# Patient Record
Sex: Male | Born: 1965 | Race: White | Hispanic: No | Marital: Married | Smoking: Former smoker
Health system: Southern US, Community
[De-identification: ages and names within clinical notes are randomized; demographics above are authoritative.]

## PROBLEM LIST (undated history)

## (undated) DIAGNOSIS — Z8719 Personal history of other diseases of the digestive system: Secondary | ICD-10-CM

## (undated) DIAGNOSIS — C189 Malignant neoplasm of colon, unspecified: Secondary | ICD-10-CM

## (undated) DIAGNOSIS — M1990 Unspecified osteoarthritis, unspecified site: Secondary | ICD-10-CM

## (undated) DIAGNOSIS — Z860100 Personal history of colon polyps, unspecified: Secondary | ICD-10-CM

## (undated) DIAGNOSIS — Z8711 Personal history of peptic ulcer disease: Secondary | ICD-10-CM

## (undated) DIAGNOSIS — K219 Gastro-esophageal reflux disease without esophagitis: Secondary | ICD-10-CM

## (undated) DIAGNOSIS — Z87442 Personal history of urinary calculi: Secondary | ICD-10-CM

## (undated) DIAGNOSIS — B019 Varicella without complication: Secondary | ICD-10-CM

## (undated) HISTORY — DX: Personal history of colon polyps, unspecified: Z86.0100

## (undated) HISTORY — DX: Malignant neoplasm of colon, unspecified: C18.9

## (undated) HISTORY — PX: FRACTURE SURGERY: SHX138

## (undated) HISTORY — DX: Personal history of colonic polyps: Z86.010

## (undated) HISTORY — PX: TONSILLECTOMY: SUR1361

## (undated) HISTORY — DX: Personal history of other diseases of the digestive system: Z87.19

## (undated) HISTORY — PX: COLONOSCOPY: SHX174

## (undated) HISTORY — PX: FIBULA FRACTURE SURGERY: SHX947

## (undated) HISTORY — DX: Gastro-esophageal reflux disease without esophagitis: K21.9

## (undated) HISTORY — DX: Varicella without complication: B01.9

## (undated) HISTORY — DX: Personal history of peptic ulcer disease: Z87.11

---

## 1998-12-21 DIAGNOSIS — C189 Malignant neoplasm of colon, unspecified: Secondary | ICD-10-CM

## 1998-12-21 HISTORY — PX: COLON SURGERY: SHX602

## 1998-12-21 HISTORY — DX: Malignant neoplasm of colon, unspecified: C18.9

## 2011-05-25 ENCOUNTER — Emergency Department (HOSPITAL_COMMUNITY)
Admission: EM | Admit: 2011-05-25 | Discharge: 2011-05-25 | Disposition: A | Discharge status: Home / Self Care | Payer: Worker's Compensation | Attending: Emergency Medicine | Admitting: Emergency Medicine

## 2011-05-25 ENCOUNTER — Emergency Department (HOSPITAL_COMMUNITY): Payer: Self-pay

## 2011-05-25 DIAGNOSIS — W010XXA Fall on same level from slipping, tripping and stumbling without subsequent striking against object, initial encounter: Secondary | ICD-10-CM | POA: Insufficient documentation

## 2011-05-25 DIAGNOSIS — Z85038 Personal history of other malignant neoplasm of large intestine: Secondary | ICD-10-CM | POA: Insufficient documentation

## 2011-05-25 DIAGNOSIS — S0990XA Unspecified injury of head, initial encounter: Secondary | ICD-10-CM | POA: Insufficient documentation

## 2011-05-25 DIAGNOSIS — S0180XA Unspecified open wound of other part of head, initial encounter: Secondary | ICD-10-CM | POA: Insufficient documentation

## 2011-05-25 DIAGNOSIS — S0083XA Contusion of other part of head, initial encounter: Secondary | ICD-10-CM | POA: Insufficient documentation

## 2011-05-25 DIAGNOSIS — K219 Gastro-esophageal reflux disease without esophagitis: Secondary | ICD-10-CM | POA: Insufficient documentation

## 2011-05-25 DIAGNOSIS — S0003XA Contusion of scalp, initial encounter: Secondary | ICD-10-CM | POA: Insufficient documentation

## 2014-10-02 ENCOUNTER — Encounter: Payer: Self-pay | Admitting: Internal Medicine

## 2014-10-02 ENCOUNTER — Ambulatory Visit (INDEPENDENT_AMBULATORY_CARE_PROVIDER_SITE_OTHER): Payer: BC Managed Care – PPO | Admitting: Internal Medicine

## 2014-10-02 ENCOUNTER — Other Ambulatory Visit: Payer: Self-pay

## 2014-10-02 VITALS — BP 122/82 | HR 64 | Temp 97.9°F | Ht 68.25 in | Wt 180.5 lb

## 2014-10-02 DIAGNOSIS — C189 Malignant neoplasm of colon, unspecified: Secondary | ICD-10-CM

## 2014-10-02 DIAGNOSIS — N529 Male erectile dysfunction, unspecified: Secondary | ICD-10-CM

## 2014-10-02 MED ORDER — TADALAFIL 5 MG PO TABS: 5.0000 mg | ORAL_TABLET | Freq: Every day | ORAL | Status: DC | PRN

## 2014-10-02 NOTE — Progress Notes (Signed)
Pre visit review using our clinic review tool, if applicable. No additional management support is needed unless otherwise documented below in the visit note. 

## 2014-10-02 NOTE — Assessment & Plan Note (Signed)
Will restart cialis RX and coupon for one free month provided

## 2014-10-02 NOTE — Telephone Encounter (Signed)
Pt's wife left v/m requesting # 30 of Cialis 5 mg since Webb Silversmith NP gave pt trial discount card for # 30 Cialis. Please advise. [place]

## 2014-10-02 NOTE — Assessment & Plan Note (Signed)
Will refer to GI for repeat colonoscopy

## 2014-10-02 NOTE — Progress Notes (Signed)
HPI  Pt presents to the clinic today to establish care. He has not had a PCP in many years. He has a history of colon cancer s/p resection, colostomy, colostomy reversal and chemo. He has not followed up with GI or Oncology since 2006. Additionally, he would like to be put back on his daily cialis.  Flu: 09/2014 Tetanus: > 10 years ago Eye Doctor: as needed Dentist: as needed Colon Screening: ?2006 (hx of colon cancer)  Past Medical History  Diagnosis Date  . Colon cancer 2000  . Chicken pox   . Kidney stones   . Hx of colonic polyps   . History of stomach ulcers     Current Outpatient Prescriptions  Medication Sig Dispense Refill  . aspirin EC 81 MG tablet Take 1 tablet by mouth daily.      . clindamycin (CLEOCIN) 300 MG capsule Take 300 mg by mouth 2 (two) times daily. For cellulitis      . Multiple Vitamin (MULTI-VITAMINS) TABS Take 1 tablet by mouth.      . ranitidine (ZANTAC) 75 MG tablet Take 1 tablet by mouth 2 (two) times daily.       No current facility-administered medications for this visit.    Allergies  Allergen Reactions  . Fluconazole Rash    Family History  Problem Relation Age of Onset  . Cancer Father     Cancerous Polyps  . Heart disease Father   . Hyperlipidemia Father   . Cancer Paternal Grandfather     Lung    History   Social History  . Marital Status: Married    Spouse Name: N/A    Number of Children: N/A  . Years of Education: N/A   Occupational History  . Not on file.   Social History Main Topics  . Smoking status: Former Research scientist (life sciences)  . Smokeless tobacco: Never Used     Comment: Quit in 1999  . Alcohol Use: No  . Drug Use: Not on file  . Sexual Activity: Not on file   Other Topics Concern  . Not on file   Social History Narrative  . No narrative on file    ROS:  Constitutional: Denies fever, malaise, fatigue, headache or abrupt weight changes.  Respiratory: Denies difficulty breathing, shortness of breath, cough or sputum  production.   Cardiovascular: Denies chest pain, chest tightness, palpitations or swelling in the hands or feet.  Gastrointestinal: Denies abdominal pain, bloating, constipation, diarrhea or blood in the stool.  GU: Pt reports erectile dysfunction. Denies frequency, urgency, pain with urination, blood in urine, odor or discharge.  No other specific complaints in a complete review of systems (except as listed in HPI above).  PE:  Ht 5' 8.25" (1.734 m)  Wt 180 lb 8 oz (81.874 kg)  BMI 27.23 kg/m2 Wt Readings from Last 3 Encounters:  10/02/14 180 lb 8 oz (81.874 kg)    General: Appears his stated age, well developed, well nourished in NAD. Cardiovascular: Normal rate and rhythm. S1,S2 noted.  No murmur, rubs or gallops noted.  Pulmonary/Chest: Normal effort and positive vesicular breath sounds. No respiratory distress. No wheezes, rales or ronchi noted.  Abdomen: Soft and nontender. Normal bowel sounds, no bruits noted. No distention or masses noted. Liver, spleen and kidneys non palpable.   Assessment and Plan:

## 2014-10-02 NOTE — Patient Instructions (Addendum)
Erectile Dysfunction Erectile dysfunction is the inability to get or sustain a good enough erection to have sexual intercourse. Erectile dysfunction may involve:  Inability to get an erection.  Lack of enough hardness to allow penetration.  Loss of the erection before sex is finished.  Premature ejaculation. CAUSES  Certain drugs, such as:  Pain relievers.  Antihistamines.  Antidepressants.  Blood pressure medicines.  Water pills (diuretics).  Ulcer medicines.  Muscle relaxants.  Illegal drugs.  Excessive drinking.  Psychological causes, such as:  Anxiety.  Depression.  Sadness.  Exhaustion.  Performance fear.  Stress.  Physical causes, such as:  Artery problems. This may include diabetes, smoking, liver disease, or atherosclerosis.  High blood pressure.  Hormonal problems, such as low testosterone.  Obesity.  Nerve problems. This may include back or pelvic injuries, diabetes mellitus, multiple sclerosis, or Parkinson disease. SYMPTOMS  Inability to get an erection.  Lack of enough hardness to allow penetration.  Loss of the erection before sex is finished.  Premature ejaculation.  Normal erections at some times, but with frequent unsatisfactory episodes.  Orgasms that are not satisfactory in sensation or frequency.  Low sexual satisfaction in either partner because of erection problems.  A curved penis occurring with erection. The curve may cause pain or may be too curved to allow for intercourse.  Never having nighttime erections. DIAGNOSIS Your caregiver can often diagnose this condition by:  Performing a physical exam to find other diseases or specific problems with the penis.  Asking you detailed questions about the problem.  Performing blood tests to check for diabetes mellitus or to measure hormone levels.  Performing urine tests to find other underlying health conditions.  Performing an ultrasound exam to check for  scarring.  Performing a test to check blood flow to the penis.  Doing a sleep study at home to measure nighttime erections. TREATMENT   You may be prescribed medicines by mouth.  You may be given medicine injections into the penis.  You may be prescribed a vacuum pump with a ring.  Penile implant surgery may be performed. You may receive:  An inflatable implant.  A semirigid implant.  Blood vessel surgery may be performed. HOME CARE INSTRUCTIONS  If you are prescribed oral medicine, you should take the medicine as prescribed. Do not increase the dosage without first discussing it with your physician.  If you are using self-injections, be careful to avoid any veins that are on the surface of the penis. Apply pressure to the injection site for 5 minutes.  If you are using a vacuum pump, make sure you have read the instructions before using it. Discuss any questions with your physician before taking the pump home. SEEK MEDICAL CARE IF:  You experience pain that is not responsive to the pain medicine you have been prescribed.  You experience nausea or vomiting. SEEK IMMEDIATE MEDICAL CARE IF:   When taking oral or injectable medications, you experience an erection that lasts longer than 4 hours. If your physician is unavailable, go to the nearest emergency room for evaluation. An erection that lasts much longer than 4 hours can result in permanent damage to your penis.  You have pain that is severe.  You develop redness, severe pain, or severe swelling of your penis.  You have redness spreading up into your groin or lower abdomen.  You are unable to pass your urine. Document Released: 12/04/2000 Document Revised: 08/09/2013 Document Reviewed: 05/11/2013 [facility] Patient Information 2015 [place], [place]. This information is not  intended to replace advice given to you by your health care provider. Make sure you discuss any questions you have with your health care provider.

## 2014-11-01 ENCOUNTER — Ambulatory Visit: Payer: Self-pay | Admitting: Unknown Physician Specialty

## 2014-11-21 ENCOUNTER — Emergency Department: Payer: Self-pay | Admitting: Emergency Medicine

## 2014-11-21 ENCOUNTER — Telehealth: Payer: Self-pay

## 2014-11-21 LAB — URINALYSIS, COMPLETE
BACTERIA: NONE SEEN
Bilirubin,UR: NEGATIVE
Blood: NEGATIVE
Glucose,UR: NEGATIVE mg/dL (ref 0–75)
LEUKOCYTE ESTERASE: NEGATIVE
NITRITE: NEGATIVE
PH: 6 (ref 4.5–8.0)
PROTEIN: NEGATIVE
RBC,UR: <1 /HPF (ref 0–5)
SPECIFIC GRAVITY: 1.006 (ref 1.003–1.030)
Squamous Epithelial: NONE SEEN

## 2014-11-21 LAB — CBC WITH DIFFERENTIAL/PLATELET
BASOS ABS: 0 10*3/uL (ref 0.0–0.1)
BASOS PCT: 0.4 %
EOS ABS: 0.1 10*3/uL (ref 0.0–0.7)
Eosinophil %: 2.5 %
HCT: 41.9 % (ref 40.0–52.0)
HGB: 13.9 g/dL (ref 13.0–18.0)
LYMPHS ABS: 1.8 10*3/uL (ref 1.0–3.6)
Lymphocyte %: 40.9 %
MCH: 30.6 pg (ref 26.0–34.0)
MCHC: 33.2 g/dL (ref 32.0–36.0)
MCV: 92 fL (ref 80–100)
MONO ABS: 0.6 x10 3/mm (ref 0.2–1.0)
Monocyte %: 13.9 %
NEUTROS ABS: 1.9 10*3/uL (ref 1.4–6.5)
Neutrophil %: 42.3 %
Platelet: 229 10*3/uL (ref 150–440)
RBC: 4.54 10*6/uL (ref 4.40–5.90)
RDW: 13.1 % (ref 11.5–14.5)
WBC: 4.4 10*3/uL (ref 3.8–10.6)

## 2014-11-21 LAB — COMPREHENSIVE METABOLIC PANEL
ALBUMIN: 3.4 g/dL (ref 3.4–5.0)
ANION GAP: 8 (ref 7–16)
Alkaline Phosphatase: 148 U/L — ABNORMAL HIGH
BUN: 14 mg/dL (ref 7–18)
Bilirubin,Total: 0.4 mg/dL (ref 0.2–1.0)
CALCIUM: 8.9 mg/dL (ref 8.5–10.1)
CO2: 25 mmol/L (ref 21–32)
CREATININE: 0.89 mg/dL (ref 0.60–1.30)
Chloride: 105 mmol/L (ref 98–107)
EGFR (African American): >60
GLUCOSE: 88 mg/dL (ref 65–99)
OSMOLALITY: 276 (ref 275–301)
POTASSIUM: 3.9 mmol/L (ref 3.5–5.1)
SGOT(AST): 29 U/L (ref 15–37)
SGPT (ALT): 40 U/L
SODIUM: 138 mmol/L (ref 136–145)
TOTAL PROTEIN: 7.1 g/dL (ref 6.4–8.2)

## 2014-11-21 LAB — LACTATE DEHYDROGENASE: LDH: 187 U/L (ref 85–241)

## 2014-11-21 LAB — TROPONIN I: Troponin-I: <0.02 ng/mL

## 2014-11-21 LAB — LIPASE, BLOOD: LIPASE: 65 U/L — AB (ref 73–393)

## 2014-11-21 NOTE — Telephone Encounter (Signed)
PLEASE NOTE: All timestamps contained within this report are represented as [place] Standard Time. CONFIDENTIALTY NOTICE: This fax transmission is intended only for the addressee. It contains information that is legally privileged, confidential or otherwise protected from use or disclosure. If you are not the intended recipient, you are strictly prohibited from reviewing, disclosing, copying using or disseminating any of this information or taking any action in reliance on or regarding this information. If you have received this fax in error, please notify us immediately by telephone so that we can arrange for its return to [place]. Phone: [phone], Toll-Free: [phone], Fax: [phone] Page: 1 of 2 Call Id: [id number] [place] Patient Name: Alex Carr Gender: Male DOB: [birth year] Age: 48 Y 3 M 10 D Return Phone Number: [phone] (Primary) Address: [address] City/State/Zip: [place] [address] Client [facility] - Client Client Site [facility], [place] Type Call Call Type Triage / [place] Name Vaughan Basta Relationship To Patient Spouse Return Phone Number [phone] (Primary) Chief Complaint Abdominal Pain Initial Comment Caller states husband was put on new medication about a month ago. having trouble with digestive system. today, having abdominal pain, not wanting to eat. cramping PreDisposition Did not know what to do Nurse Assessment Nurse: Mechele Dawley, RN, Amy Date/Time Eilene Ghazi Time): 11/21/2014 3:27:07 PM Confirm and document reason for call. If symptomatic, describe symptoms. ---COLON CA - 2000 - SPOUSE STATES THAT HE HAS NOT BEEN FEELING WELL. HE HAS BEEN STARTED ON PRILOSEC ABOUT A MONTH AGO AND THAT IS ABOUT THE TIME THAT HE STARTED HAVING THESE SYMPTOMS. CRAMPING, CAN'T SLEEP, NOT  HUNGRY, IRREGULAR BM'S. COLOSTOMY BAG FEELS LIKE THERE IS A KNOT THERE. COLON SPASMS PREVIOUS BUT HE FEELS THIS IS DIFFERENT PAIN. Has the patient traveled out of the country within the last 30 days? ---Not Applicable Does the patient require triage? ---Yes Related visit to physician within the last 2 weeks? ---No Does the PT have any chronic conditions? (i.e. diabetes, asthma, etc.) ---Yes List chronic conditions. ---CA - 200 COLON CA DX ULCER MUCH YOUNGER AGE Guidelines Guideline Title Affirmed Question Affirmed Notes Nurse Date/Time (Eastern Time) Abdominal Pain - Male Patient sounds very sick or weak to the triager SPOUSE IS HAVING TO CALL DUE TO HIM BEING AT WORK - DUE TO HISTORY NEEDS TO [place], RN, [place] 11/21/2014 3:31:38 PM Disp. Time Eilene Ghazi Time) Disposition Final User PLEASE NOTE: All timestamps contained within this report are represented as [place] Standard Time. CONFIDENTIALTY NOTICE: This fax transmission is intended only for the addressee. It contains information that is legally privileged, confidential or otherwise protected from use or disclosure. If you are not the intended recipient, you are strictly prohibited from reviewing, disclosing, copying using or disseminating any of this information or taking any action in reliance on or regarding this information. If you have received this fax in error, please notify us immediately by telephone so that we can arrange for its return to [place]. Phone: [phone], Toll-Free: [phone], Fax: [phone] Page: 2 of 2 Call Id: [id number] 11/21/2014 3:37:11 PM Go to ED Now (or PCP triage) Yes Mechele Dawley, RN, Amy Caller Understands: Yes Disagree/Comply: Comply Care Advice Given Per Guideline GO TO ED NOW (OR PCP TRIAGE): CARE ADVICE given per Abdominal Pain, Male (Adult) guideline. DRIVING: Another adult should drive. Referrals GO TO FACILITY UNDECIDED

## 2014-11-23 ENCOUNTER — Ambulatory Visit: Payer: Self-pay | Admitting: Hematology and Oncology

## 2014-11-23 LAB — KAPPA/LAMBDA FREE LIGHT CHAINS (ARMC)

## 2014-11-23 LAB — PROTEIN ELECTROPHORESIS(ARMC)

## 2014-11-23 LAB — PSA: PSA: 0.6 ng/mL (ref 0.0–4.0)

## 2014-11-23 LAB — CEA: CEA: 0.8 ng/mL (ref 0.0–4.7)

## 2014-12-21 ENCOUNTER — Ambulatory Visit: Payer: Self-pay | Admitting: Hematology and Oncology

## 2014-12-24 ENCOUNTER — Other Ambulatory Visit: Payer: Self-pay | Admitting: Internal Medicine

## 2014-12-24 MED ORDER — OMEPRAZOLE 20 MG PO CPDR: 20.0000 mg | DELAYED_RELEASE_CAPSULE | Freq: Every day | ORAL | Status: DC

## 2014-12-27 ENCOUNTER — Ambulatory Visit: Payer: Self-pay | Admitting: Gastroenterology

## 2015-01-07 ENCOUNTER — Encounter: Payer: Self-pay | Admitting: Internal Medicine

## 2015-02-26 ENCOUNTER — Ambulatory Visit
Admit: 2015-02-26 | Disposition: A | Discharge status: Home / Self Care | Payer: Self-pay | Attending: Hematology and Oncology | Admitting: Hematology and Oncology

## 2015-05-11 ENCOUNTER — Other Ambulatory Visit: Payer: Self-pay | Admitting: Internal Medicine

## 2015-07-25 LAB — LIPID PANEL
Cholesterol: 158 mg/dL (ref 0–200)
HDL: 66 mg/dL (ref 35–70)
LDL Cholesterol: 78 mg/dL
TRIGLYCERIDES: 70 mg/dL (ref 40–160)

## 2015-07-25 LAB — TSH: TSH: 2.23 u[IU]/mL (ref <=5.90)

## 2015-07-25 LAB — HEPATIC FUNCTION PANEL: Alkaline Phosphatase: 168 U/L — AB (ref 25–125)

## 2015-07-25 LAB — HEMOGLOBIN A1C: Hemoglobin A1C: 5.4

## 2015-10-11 ENCOUNTER — Other Ambulatory Visit: Payer: Self-pay | Admitting: Internal Medicine

## 2015-10-11 NOTE — Telephone Encounter (Signed)
Last filled 10/02/14--est care appt--no upcoming appts--please advise

## 2015-12-19 ENCOUNTER — Ambulatory Visit (INDEPENDENT_AMBULATORY_CARE_PROVIDER_SITE_OTHER): Payer: BLUE CROSS/BLUE SHIELD | Admitting: Internal Medicine

## 2015-12-19 ENCOUNTER — Encounter: Payer: Self-pay | Admitting: Internal Medicine

## 2015-12-19 VITALS — BP 126/82 | HR 77 | Temp 98.1°F | Ht 68.5 in | Wt 198.0 lb

## 2015-12-19 DIAGNOSIS — Z0000 Encounter for general adult medical examination without abnormal findings: Secondary | ICD-10-CM | POA: Diagnosis not present

## 2015-12-19 DIAGNOSIS — Z23 Encounter for immunization: Secondary | ICD-10-CM | POA: Diagnosis not present

## 2015-12-19 MED ORDER — OMEPRAZOLE 20 MG PO CPDR: 20.0000 mg | DELAYED_RELEASE_CAPSULE | Freq: Every day | ORAL | Status: DC

## 2015-12-19 NOTE — Addendum Note (Signed)
Addended by: Lurlean Nanny on: 12/19/2015 01:49 PM   Modules accepted: Orders

## 2015-12-19 NOTE — Addendum Note (Signed)
Addended by: Lurlean Nanny on: 12/19/2015 05:04 PM   Modules accepted: Orders

## 2015-12-19 NOTE — Progress Notes (Signed)
Subjective:    Patient ID: Alex Carr, male    DOB: March 12, 1966, 49 y.o.   MRN: [id number]  HPI  Pt presents to the clinic today for his annual exam.  Flu: 09/2015 Tetanus: > 10 years ago PSA Screening: 11/2015 Colon Screening: 12/27/2014 Vision Screening: 04/2015 Dentist: as needed  Diet: He does eat lean meat. He consumes fruits and veggies a few days per week. He does not consume any fried foods. He drinks mostly water and coffee, and unsweet tea. Exercise: None  Review of Systems      Past Medical History  Diagnosis Date  . Colon cancer 2000  . Chicken pox   . Kidney stones   . Hx of colonic polyps   . History of stomach ulcers     Current Outpatient Prescriptions  Medication Sig Dispense Refill  . aspirin EC 81 MG tablet Take 1 tablet by mouth daily.    . clindamycin (CLEOCIN) 300 MG capsule Take 300 mg by mouth 2 (two) times daily. For cellulitis    . Multiple Vitamin (MULTI-VITAMINS) TABS Take 1 tablet by mouth.    Marland Kitchen omeprazole (PRILOSEC) 20 MG capsule TAKE ONE CAPSULE BY MOUTH ONCE DAILY 30 capsule 6  . ranitidine (ZANTAC) 75 MG tablet Take 1 tablet by mouth 2 (two) times daily.    . tadalafil (CIALIS) 5 MG tablet Take 1 tablet (5 mg total) by mouth daily as needed for erectile dysfunction. MUST SCHEDULE ANNUAL PHYSICAL FOR MORE REFILLS [phone] 30 tablet 0   No current facility-administered medications for this visit.    Allergies  Allergen Reactions  . Fluconazole Rash    Family History  Problem Relation Age of Onset  . Cancer Father     Cancerous Polyps  . Heart disease Father   . Hyperlipidemia Father   . Cancer Paternal Grandfather     Lung    Social History   Social History  . Marital Status: Married    Spouse Name: N/A  . Number of Children: N/A  . Years of Education: N/A   Occupational History  . Not on file.   Social History Main Topics  . Smoking status: Former Research scientist (life sciences)  . Smokeless tobacco: Never Used     Comment: Quit in  1999  . Alcohol Use: No  . Drug Use: No  . Sexual Activity: Yes   Other Topics Concern  . Not on file   Social History Narrative  . No narrative on file     Constitutional: Denies fever, malaise, fatigue, headache or abrupt weight changes.  HEENT: Denies eye pain, eye redness, ear pain, ringing in the ears, wax buildup, runny nose, nasal congestion, bloody nose, or sore throat. Respiratory: Denies difficulty breathing, shortness of breath, cough or sputum production.   Cardiovascular: Denies chest pain, chest tightness, palpitations or swelling in the hands or feet.  Gastrointestinal: Pt reports occasional reflux. Denies abdominal pain, bloating, constipation, diarrhea or blood in the stool.  GU: Denies urgency, frequency, pain with urination, burning sensation, blood in urine, odor or discharge. Musculoskeletal: Denies decrease in range of motion, difficulty with gait, muscle pain or joint pain and swelling.  Skin: Denies redness, rashes, lesions or ulcercations.  Neurological: Denies dizziness, difficulty with memory, difficulty with speech or problems with balance and coordination.  Psych: Denies anxiety, depression, SI/HI.  No other specific complaints in a complete review of systems (except as listed in HPI above).  Objective:   Physical Exam  BP 126/82 mmHg  Pulse 77  Temp(Src) 98.1 F (36.7 C) (Oral)  Ht 5' 8.5" (1.74 m)  Wt 198 lb (89.812 kg)  BMI 29.66 kg/m2  SpO2 98% Wt Readings from Last 3 Encounters:  12/19/15 198 lb (89.812 kg)  10/02/14 180 lb 8 oz (81.874 kg)    General: Appears his stated age, well developed, well nourished in NAD. Skin: Warm, dry and intact. No rashes, lesions or ulcerations noted. Large abdominal scar noted. HEENT: Head: normal shape and size; Eyes: sclera white, no icterus, conjunctiva pink, PERRLA and EOMs intact; Ears: cerumen impaction bilaterally; Nose: mucosa pink and moist, septum midline; Throat/Mouth: Teeth present, mucosa pink  and moist, no exudate, lesions or ulcerations noted.  Neck:  Neck supple, trachea midline. No masses, lumps or thyromegaly present.  Cardiovascular: Normal rate and rhythm. S1,S2 noted.  No murmur, rubs or gallops noted. No JVD or BLE edema. No carotid bruits noted. Pulmonary/Chest: Normal effort and positive vesicular breath sounds. No respiratory distress. No wheezes, rales or ronchi noted.  Abdomen: Soft and nontender. Normal bowel sounds. No distention or masses noted. Liver, spleen and kidneys non palpable. Musculoskeletal: Strength 5/5 BUE/BLE. No signs of joint swelling. No difficulty with gait.  Neurological: Alert and oriented. Cranial nerves II-XII grossly  intact. Coordination normal.  Psychiatric: Mood and affect normal. Behavior is normal. Judgment and thought content normal.     BMET    Component Value Date/Time   NA 138 11/21/2014 1758   K 3.9 11/21/2014 1758   CL 105 11/21/2014 1758   CO2 25 11/21/2014 1758   GLUCOSE 88 11/21/2014 1758   BUN 14 11/21/2014 1758   CREATININE 0.89 11/21/2014 1758   CALCIUM 8.9 11/21/2014 1758   GFRNONAA >60 11/21/2014 1758   GFRAA >60 11/21/2014 1758    Lipid Panel  No results found for: CHOL, TRIG, HDL, CHOLHDL, VLDL, LDLCALC  CBC    Component Value Date/Time   WBC 4.4 11/21/2014 1758   RBC 4.54 11/21/2014 1758   HGB 13.9 11/21/2014 1758   HCT 41.9 11/21/2014 1758   PLT 229 11/21/2014 1758   MCV 92 11/21/2014 1758   MCH 30.6 11/21/2014 1758   MCHC 33.2 11/21/2014 1758   RDW 13.1 11/21/2014 1758   LYMPHSABS 1.8 11/21/2014 1758   MONOABS 0.6 11/21/2014 1758   EOSABS 0.1 11/21/2014 1758   BASOSABS 0.0 11/21/2014 1758    Hgb A1C No results found for: HGBA1C       Assessment & Plan:   Preventative Health Maintenance:  Flu UTD Tdap today Colonoscopy UTD Encouraged him to see an eye doctor and dentist annually Labs from 07/2015 reviewed- all normal Encouraged him to consume a balanced diet and start an exercise  regimen  RTC in 1 year of sooner if needed

## 2015-12-19 NOTE — Progress Notes (Signed)
Pre visit review using our clinic review tool, if applicable. No additional management support is needed unless otherwise documented below in the visit note. 

## 2015-12-19 NOTE — Patient Instructions (Signed)

## 2016-02-10 IMAGING — CT CT ABD-PELV W/ CM
2 of 5 series · 15 of 46 positions shown, 17 images · IV contrast (isovue)
Comparison: None.

CLINICAL DATA: Significant left lower quadrant pain with
intermittent cramping for 2 days, history of colostomy, history of
colon cancer.

EXAM:
CT ABDOMEN AND PELVIS WITH CONTRAST
TECHNIQUE: Multidetector CT imaging of the abdomen and pelvis was performed
using the standard protocol following bolus administration of
intravenous contrast.
CONTRAST:  100 cc Isovue

[Series 2: routine abd pel with · axial · 0.80mm/px · z∈[-1163,-758]mm · 12 of 93 slices shown, 14 images]
[im 6/93  soft-tissue]
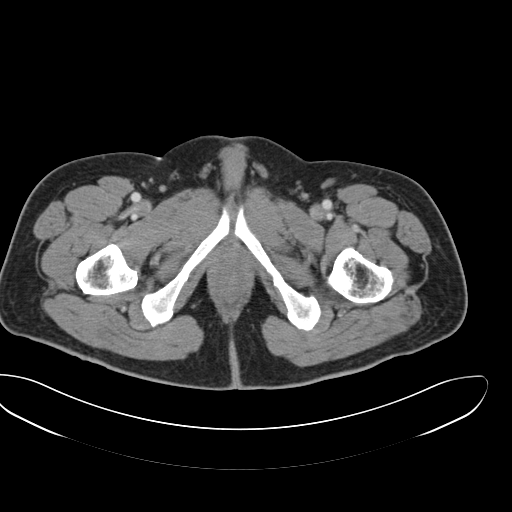
[im 6/93  bone]
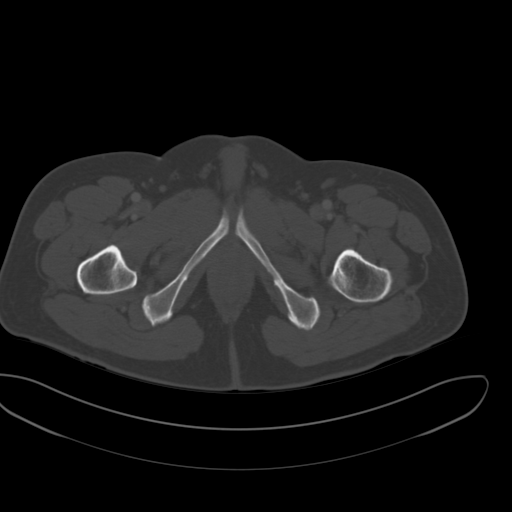
[im 16/93  soft-tissue]
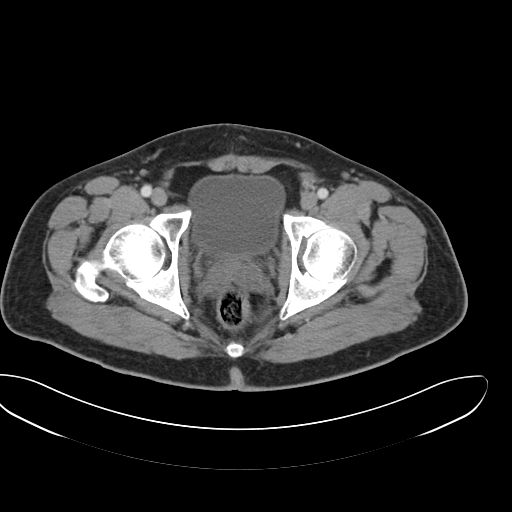
[im 21/93  soft-tissue]
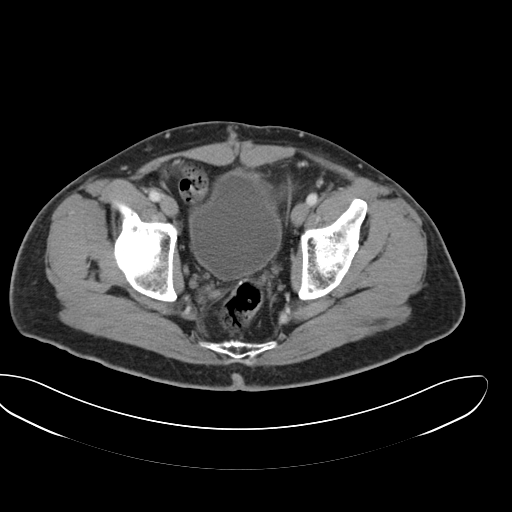
[im 26/93  soft-tissue]
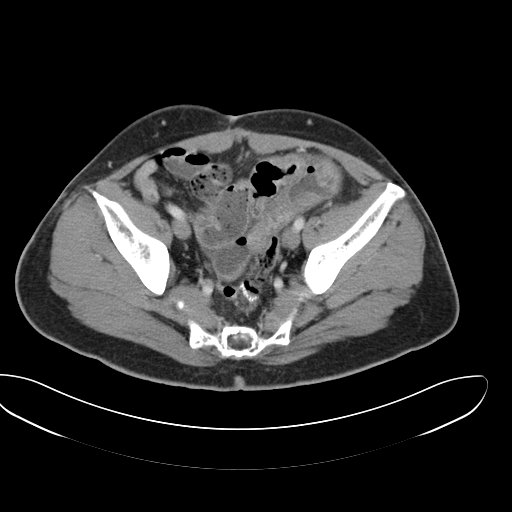
[im 36/93  soft-tissue]
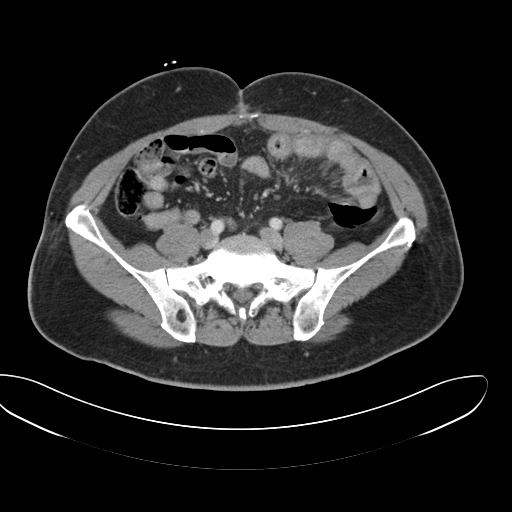
[im 41/93  soft-tissue]
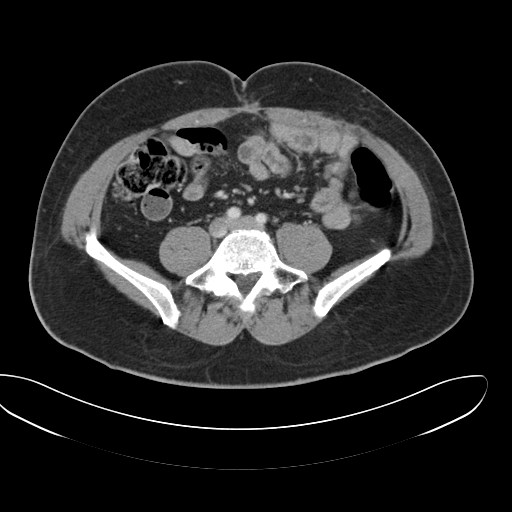
[im 52/93  soft-tissue]
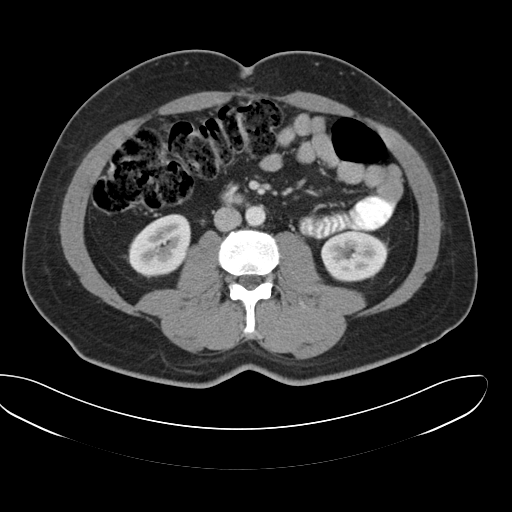
[im 57/93  soft-tissue]
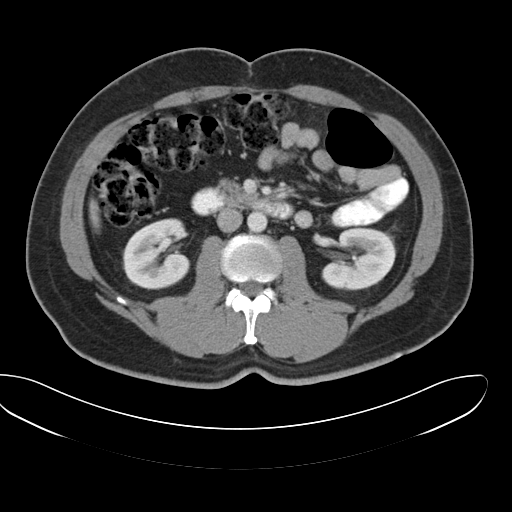
[im 67/93  soft-tissue]
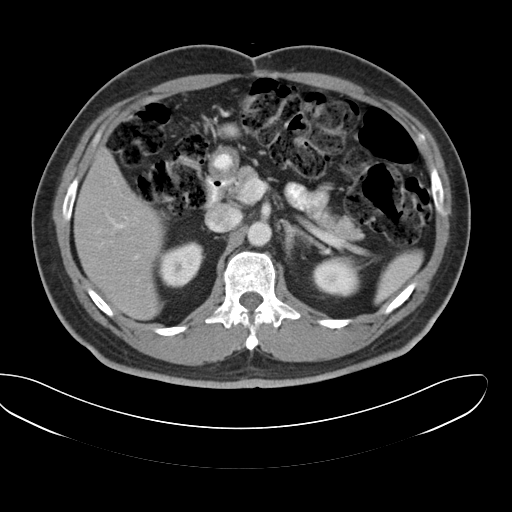
[im 67/93  bone]
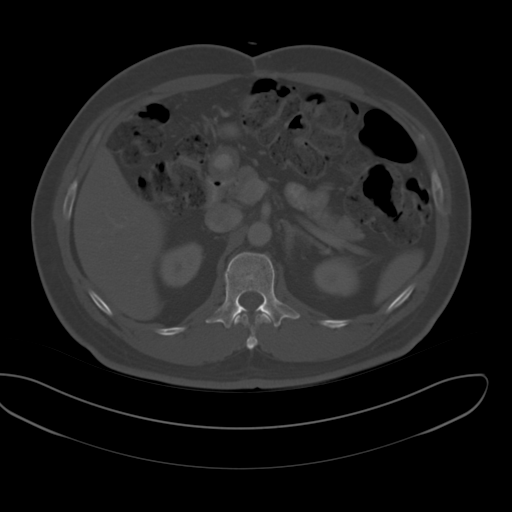
[im 72/93  soft-tissue]
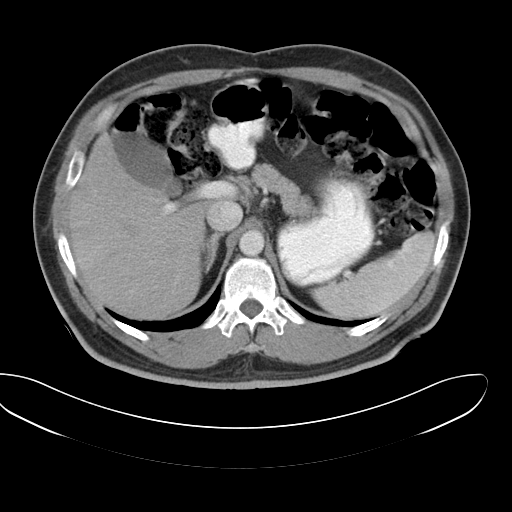
[im 77/93  soft-tissue]
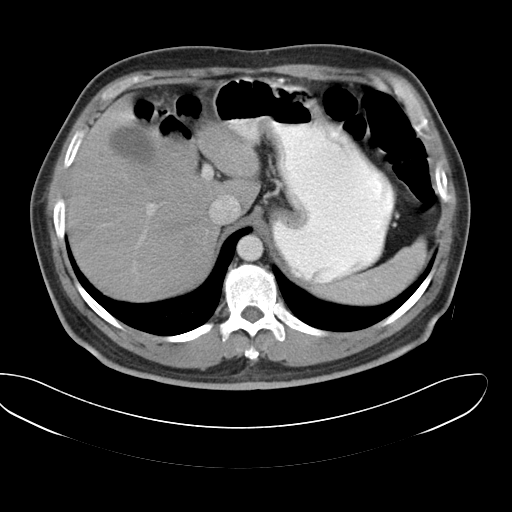
[im 87/93  soft-tissue]
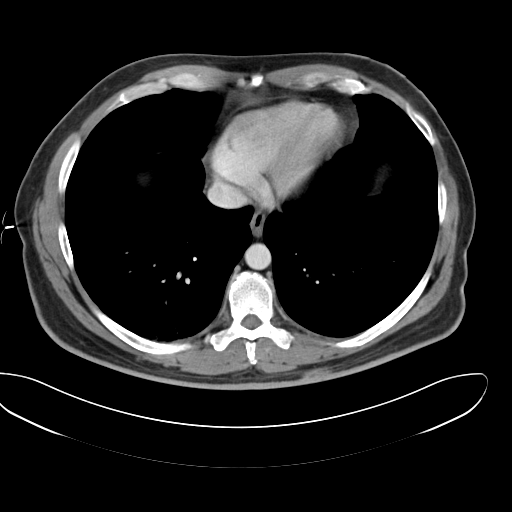

[Series 6: cor routine abd pel with · coronal · 0.76mm/px · 3 of 131 slices shown]
[im 44/131  soft-tissue]
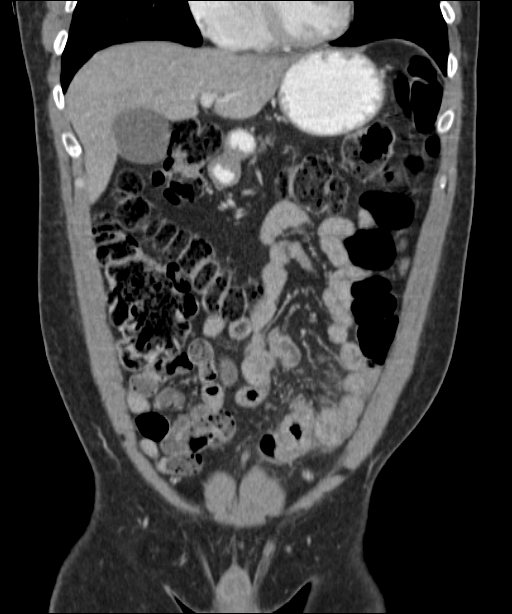
[im 58/131  soft-tissue]
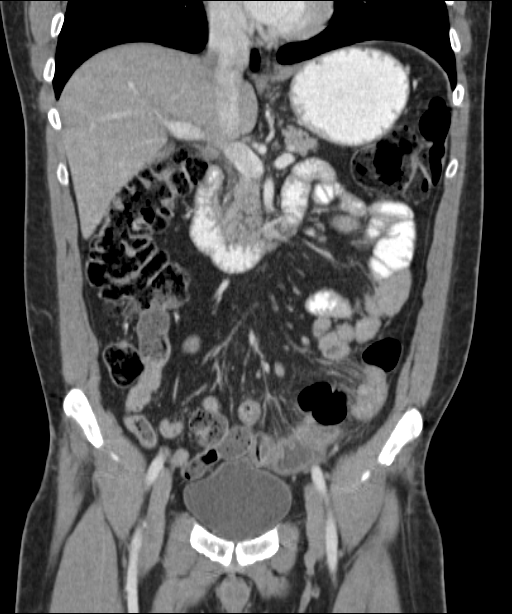
[im 73/131  soft-tissue]
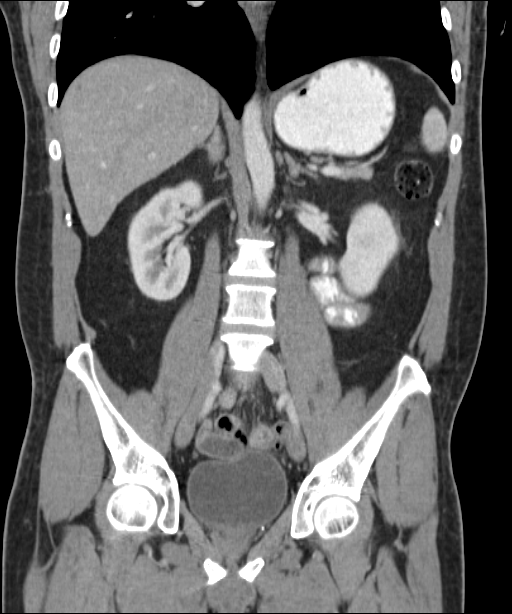

[15 of 46 positions shown; findings below may reference images not displayed]

FINDINGS: Sagittal images of the spine shows mild degenerative changes
thoracolumbar spine. There is symmetrical central lytic appearance
within sacrum with peripheral sclerosis and loss of trabeculation
there is no cortical destruction. Findings are suspicious for
Paget's disease or postradiation changes. Less likely metastatic
disease. Clinical correlation is necessary. Further correlation with
MRI could be performed as clinically warranted.

Enhanced liver is unremarkable. The pancreas, spleen and adrenal
glands are unremarkable.

Abundant stool is noted in right colon and transverse colon.

Kidneys are symmetrical in size and enhancement. No hydronephrosis
or hydroureter.

There is moderate gas in left colon and descending colon. Rest
probable partial sigmoid colon resection. Some stool is noted at the
level of anastomosis of colon with rectum. No definite colonic
obstruction. Mild distal colonic ileus cannot be excluded.

Axial images 56 and 60 there is mild segmental thickening of small
bowel wall in left lower quadrant. There is enhancement of the
mucosa at this level. Mild small bowel dilatation with some fluid in
axial image 69. There is some fecal like material in small bowel
axial image 67. Findings are highly suspicious for segmental
enteritis less likely a small bowel obstruction as there is no
transition in caliber. Prostate gland and seminal vesicles are
unremarkable.

Delayed renal images shows bilateral renal symmetrical excretion.
There is scarring in left lower quadrant abdominal wall at the site
of previous colostomy.
IMPRESSION: 1. There is segmental mild thickening of the wall of small bowel
loops in left lower quadrant and mild enhancement of mucosa. Some
fluid and fecal like material is noted in small bowel beyond this
point. Findings are highly suspicious for segmental enteritis. Less
likely small bowel obstruction.
2. Abundant stool is noted in right colon and transverse colon.
Moderate gas is noted in descending colon and distal colon. Mild
distal colonic ileus cannot be excluded. The patient is status post
sigmoid colon resection. There is some stool at the level of
anastomosis of distal colon with rectum. No definite colonic
obstruction.
3. No free abdominal air.
4. No hydronephrosis or hydroureter. Bilateral renal symmetrical
excretion.
5. There is symmetrical central lytic appearance within sacrum with
peripheral sclerosis and loss of trabeculation there is no cortical
destruction. Findings are suspicious for Paget's disease or
postradiation changes. Less likely metastatic disease. Clinical
correlation is necessary. Further correlation with MRI could be
performed as clinically warranted.

## 2016-07-28 ENCOUNTER — Encounter: Payer: Self-pay | Admitting: Internal Medicine

## 2016-07-28 ENCOUNTER — Ambulatory Visit (INDEPENDENT_AMBULATORY_CARE_PROVIDER_SITE_OTHER): Payer: BLUE CROSS/BLUE SHIELD | Admitting: Internal Medicine

## 2016-07-28 VITALS — BP 122/80 | HR 67 | Temp 97.8°F | Wt 209.5 lb

## 2016-07-28 DIAGNOSIS — R1031 Right lower quadrant pain: Secondary | ICD-10-CM | POA: Diagnosis not present

## 2016-07-28 DIAGNOSIS — R110 Nausea: Secondary | ICD-10-CM

## 2016-07-28 DIAGNOSIS — R630 Anorexia: Secondary | ICD-10-CM | POA: Diagnosis not present

## 2016-07-28 LAB — COMPREHENSIVE METABOLIC PANEL
ALBUMIN: 4.3 g/dL (ref 3.5–5.2)
ALK PHOS: 169 U/L — AB (ref 39–117)
ALT: 26 U/L (ref 0–53)
AST: 22 U/L (ref 0–37)
BILIRUBIN TOTAL: 0.6 mg/dL (ref 0.2–1.2)
BUN: 12 mg/dL (ref 6–23)
CALCIUM: 9.9 mg/dL (ref 8.4–10.5)
CO2: 28 mEq/L (ref 19–32)
Chloride: 103 mEq/L (ref 96–112)
Creatinine, Ser: 0.97 mg/dL (ref 0.40–1.50)
GFR: 86.74 mL/min (ref >60.00)
GLUCOSE: 115 mg/dL — AB (ref 70–99)
POTASSIUM: 4.6 meq/L (ref 3.5–5.1)
Sodium: 138 mEq/L (ref 135–145)
TOTAL PROTEIN: 7.4 g/dL (ref 6.0–8.3)

## 2016-07-28 LAB — CBC
HEMATOCRIT: 45.1 % (ref 39.0–52.0)
HEMOGLOBIN: 15.3 g/dL (ref 13.0–17.0)
MCHC: 33.9 g/dL (ref 30.0–36.0)
MCV: 89.9 fl (ref 78.0–100.0)
PLATELETS: 209 10*3/uL (ref 150.0–400.0)
RBC: 5.01 Mil/uL (ref 4.22–5.81)
RDW: 14.1 % (ref 11.5–15.5)
WBC: 6.1 10*3/uL (ref 4.0–10.5)

## 2016-07-28 LAB — AMYLASE: AMYLASE: 46 U/L (ref 27–131)

## 2016-07-28 LAB — LIPASE: LIPASE: 16 U/L (ref 11.0–59.0)

## 2016-07-28 MED ORDER — ONDANSETRON HCL 4 MG PO TABS: 4.0000 mg | ORAL_TABLET | Freq: Three times a day (TID) | ORAL | 0 refills | Status: DC | PRN

## 2016-07-28 NOTE — Progress Notes (Signed)
Subjective:    Patient ID: Alex Carr, male    DOB: April 17, 1966, 50 y.o.   MRN: [id number]  HPI  Pt presents to the clinic today with c/o RLQ abdominal pain. This started yesterday. He describes the pain as sharp and stabbing. The pain is intermittent.The pain is associated with nausea, poor appetite, and lightheadedness. He reports he did have 4 stools yesterday, normal in consistency without blood. He usually only has 1 stool per day. He was only able to eat crackers and a popsicle yesterday. He denies fever, chills or body aches. He has tried an antacid without much relief. He has a history of stomach ulcers, and is on Protonix but he reports this feel different. He has a history of colon cancer, last colonoscopy 12/2014 reviewed. He denies fatigue, weight loss or blood in his stool. He denies recent travel or changes in diet or medication. No on in his household has similar complaints. He has not had sick contacts that he is aware of.  Review of Systems      Past Medical History:  Diagnosis Date  . Chicken pox   . Colon cancer ([place]) 2000  . History of stomach ulcers   . Hx of colonic polyps   . Kidney stones     Current Outpatient Prescriptions  Medication Sig Dispense Refill  . aspirin EC 81 MG tablet Take 1 tablet by mouth daily.    . Multiple Vitamin (MULTI-VITAMINS) TABS Take 1 tablet by mouth.    Marland Kitchen omeprazole (PRILOSEC) 20 MG capsule Take 1 capsule (20 mg total) by mouth daily. 30 capsule 11  . tadalafil (CIALIS) 5 MG tablet Take 1 tablet (5 mg total) by mouth daily as needed for erectile dysfunction. MUST SCHEDULE ANNUAL PHYSICAL FOR MORE REFILLS [phone] 30 tablet 0   No current facility-administered medications for this visit.     Allergies  Allergen Reactions  . Fluconazole Rash    Family History  Problem Relation Age of Onset  . Cancer Father     Cancerous Polyps  . Heart disease Father   . Hyperlipidemia Father   . Cancer Paternal Grandfather     Lung     Social History   Social History  . Marital status: Married    Spouse name: N/A  . Number of children: N/A  . Years of education: N/A   Occupational History  . Not on file.   Social History Main Topics  . Smoking status: Former Research scientist (life sciences)  . Smokeless tobacco: Never Used     Comment: Quit in 1999  . Alcohol use No  . Drug use: No  . Sexual activity: Yes   Other Topics Concern  . Not on file   Social History Narrative  . No narrative on file     Constitutional: Denies fever, malaise, fatigue, headache or abrupt weight changes.  Gastrointestinal: Pt reports nausea, poor appetite and RLQ abdominal pain. Denies bloating, constipation, diarrhea or blood in the stool.  GU: Denies urgency, frequency, pain with urination, burning sensation, blood in urine, odor or discharge.  No other specific complaints in a complete review of systems (except as listed in HPI above).  Objective:   Physical Exam  BP 122/80   Pulse 67   Temp 97.8 F (36.6 C) (Oral)   Wt 209 lb 8 oz (95 kg)   SpO2 98%   BMI 31.39 kg/m  Wt Readings from Last 3 Encounters:  07/28/16 209 lb 8 oz (95 kg)  12/19/15 198 lb (  89.8 kg)  10/02/14 180 lb 8 oz (81.9 kg)    General: Appears his stated age, obese in NAD. Cardiovascular: Normal rate and rhythm. S1,S2 noted.  Pulmonary/Chest: Normal effort and positive vesicular breath sounds. No respiratory distress. No wheezes, rales or ronchi noted.  Abdomen: Soft and mildly tender in the RLQ. No rebound tenderness. Normal bowel sounds. No distention or masses noted.  Musculoskeletal: Negative Psoas. Neurological: Alert and oriented. Coordination normal.    BMET    Component Value Date/Time   NA 138 11/21/2014 1758   K 3.9 11/21/2014 1758   CL 105 11/21/2014 1758   CO2 25 11/21/2014 1758   GLUCOSE 88 11/21/2014 1758   BUN 14 11/21/2014 1758   CREATININE 0.89 11/21/2014 1758   CALCIUM 8.9 11/21/2014 1758   GFRNONAA >60 11/21/2014 1758   GFRAA >60  11/21/2014 1758    Lipid Panel     Component Value Date/Time   CHOL 158 07/25/2015   TRIG 70 07/25/2015   HDL 66 07/25/2015   LDLCALC 78 07/25/2015    CBC    Component Value Date/Time   WBC 4.4 11/21/2014 1758   RBC 4.54 11/21/2014 1758   HGB 13.9 11/21/2014 1758   HCT 41.9 11/21/2014 1758   PLT 229 11/21/2014 1758   MCV 92 11/21/2014 1758   MCH 30.6 11/21/2014 1758   MCHC 33.2 11/21/2014 1758   RDW 13.1 11/21/2014 1758   LYMPHSABS 1.8 11/21/2014 1758   MONOABS 0.6 11/21/2014 1758   EOSABS 0.1 11/21/2014 1758   BASOSABS 0.0 11/21/2014 1758    Hgb A1C Lab Results  Component Value Date   HGBA1C 5.4 07/25/2015            Assessment & Plan:   Nausea with poor appetite, RLQ abdominal pain:  Viral vs early appendicitis Vitals normal which is reassuring Will check CBC, CMET, amylase, lipase eRx for Zofran for nausea Encouraged bland diet Work note provided If symptoms worsen, consider CT abdomen  Will follow up after labs, RTC as needed Webb Silversmith, NP

## 2016-07-28 NOTE — Patient Instructions (Addendum)
Nausea, Adult Nausea is the feeling that you have an upset stomach or have to vomit. Nausea by itself is not likely a serious concern, but it may be an early sign of more serious medical problems. As nausea gets worse, it can lead to vomiting. If vomiting develops, there is the risk of dehydration.  CAUSES   Viral infections.  Food poisoning.  Medicines.  Pregnancy.  Motion sickness.  Migraine headaches.  Emotional distress.  Severe pain from any source.  Alcohol intoxication. HOME CARE INSTRUCTIONS  Get plenty of rest.  Ask your caregiver about specific rehydration instructions.  Eat small amounts of food and sip liquids more often.  Take all medicines as told by your caregiver. SEEK MEDICAL CARE IF:  You have not improved after 2 days, or you get worse.  You have a headache. SEEK IMMEDIATE MEDICAL CARE IF:   You have a fever.  You faint.  You keep vomiting or have blood in your vomit.  You are extremely weak or dehydrated.  You have dark or bloody stools.  You have severe chest or abdominal pain. MAKE SURE YOU:  Understand these instructions.  Will watch your condition.  Will get help right away if you are not doing well or get worse.   This information is not intended to replace advice given to you by your health care provider. Make sure you discuss any questions you have with your health care provider.   Document Released: 01/14/2005 Document Revised: 12/28/2014 Document Reviewed: 08/19/2011 Elsevier Interactive Patient Education 2016 [employer]  Nausea, Adult Nausea is the feeling that you have an upset stomach or have to vomit. Nausea by itself is not likely a serious concern, but it may be an early sign of more serious medical problems. As nausea gets worse, it can lead to vomiting. If vomiting develops, there is the risk of dehydration.  CAUSES   Viral infections.  Food poisoning.  Medicines.  Pregnancy.  Motion  sickness.  Migraine headaches.  Emotional distress.  Severe pain from any source.  Alcohol intoxication. HOME CARE INSTRUCTIONS  Get plenty of rest.  Ask your caregiver about specific rehydration instructions.  Eat small amounts of food and sip liquids more often.  Take all medicines as told by your caregiver. SEEK MEDICAL CARE IF:  You have not improved after 2 days, or you get worse.  You have a headache. SEEK IMMEDIATE MEDICAL CARE IF:   You have a fever.  You faint.  You keep vomiting or have blood in your vomit.  You are extremely weak or dehydrated.  You have dark or bloody stools.  You have severe chest or abdominal pain. MAKE SURE YOU:  Understand these instructions.  Will watch your condition.  Will get help right away if you are not doing well or get worse.   This information is not intended to replace advice given to you by your health care provider. Make sure you discuss any questions you have with your health care provider.   Document Released: 01/14/2005 Document Revised: 12/28/2014 Document Reviewed: 08/19/2011 Elsevier Interactive Patient Education [employer].

## 2016-07-28 NOTE — Progress Notes (Signed)
Pre visit review using our clinic review tool, if applicable. No additional management support is needed unless otherwise documented below in the visit note. 

## 2016-07-29 ENCOUNTER — Telehealth: Payer: Self-pay | Admitting: Internal Medicine

## 2016-07-29 NOTE — Telephone Encounter (Signed)
Patient called to get his lab results.  Please call patient.

## 2016-09-29 ENCOUNTER — Other Ambulatory Visit: Payer: Self-pay | Admitting: Internal Medicine

## 2016-09-30 NOTE — Telephone Encounter (Signed)
Last filled 09/2015--please advise

## 2016-12-11 ENCOUNTER — Other Ambulatory Visit: Payer: Self-pay | Admitting: Internal Medicine

## 2017-01-11 ENCOUNTER — Ambulatory Visit (INDEPENDENT_AMBULATORY_CARE_PROVIDER_SITE_OTHER): Payer: BLUE CROSS/BLUE SHIELD | Admitting: Internal Medicine

## 2017-01-11 ENCOUNTER — Encounter: Payer: Self-pay | Admitting: Internal Medicine

## 2017-01-11 VITALS — BP 120/78 | HR 70 | Temp 97.9°F | Ht 68.5 in | Wt 206.0 lb

## 2017-01-11 DIAGNOSIS — K219 Gastro-esophageal reflux disease without esophagitis: Secondary | ICD-10-CM | POA: Diagnosis not present

## 2017-01-11 DIAGNOSIS — Z0000 Encounter for general adult medical examination without abnormal findings: Secondary | ICD-10-CM

## 2017-01-11 DIAGNOSIS — Z125 Encounter for screening for malignant neoplasm of prostate: Secondary | ICD-10-CM | POA: Diagnosis not present

## 2017-01-11 DIAGNOSIS — C189 Malignant neoplasm of colon, unspecified: Secondary | ICD-10-CM

## 2017-01-11 DIAGNOSIS — Z114 Encounter for screening for human immunodeficiency virus [HIV]: Secondary | ICD-10-CM

## 2017-01-11 DIAGNOSIS — Z0001 Encounter for general adult medical examination with abnormal findings: Secondary | ICD-10-CM

## 2017-01-11 DIAGNOSIS — N529 Male erectile dysfunction, unspecified: Secondary | ICD-10-CM

## 2017-01-11 LAB — LIPID PANEL
CHOL/HDL RATIO: 2
Cholesterol: 157 mg/dL (ref 0–200)
HDL: 63.5 mg/dL (ref >39.00)
LDL CALC: 80 mg/dL (ref 0–99)
NONHDL: 93.8
Triglycerides: 68 mg/dL (ref 0.0–149.0)
VLDL: 13.6 mg/dL (ref 0.0–40.0)

## 2017-01-11 LAB — CBC
HCT: 39.5 % (ref 39.0–52.0)
HEMOGLOBIN: 13.5 g/dL (ref 13.0–17.0)
MCHC: 34.2 g/dL (ref 30.0–36.0)
MCV: 88 fl (ref 78.0–100.0)
Platelets: 215 10*3/uL (ref 150.0–400.0)
RBC: 4.49 Mil/uL (ref 4.22–5.81)
RDW: 14.2 % (ref 11.5–15.5)
WBC: 4.9 10*3/uL (ref 4.0–10.5)

## 2017-01-11 LAB — PSA: PSA: 0.36 ng/mL (ref 0.10–4.00)

## 2017-01-11 LAB — COMPREHENSIVE METABOLIC PANEL
ALK PHOS: 151 U/L — AB (ref 39–117)
ALT: 26 U/L (ref 0–53)
AST: 22 U/L (ref 0–37)
Albumin: 4.2 g/dL (ref 3.5–5.2)
BUN: 13 mg/dL (ref 6–23)
CO2: 29 meq/L (ref 19–32)
Calcium: 9.6 mg/dL (ref 8.4–10.5)
Chloride: 105 mEq/L (ref 96–112)
Creatinine, Ser: 0.86 mg/dL (ref 0.40–1.50)
GFR: 99.48 mL/min (ref >60.00)
GLUCOSE: 90 mg/dL (ref 70–99)
POTASSIUM: 4.3 meq/L (ref 3.5–5.1)
SODIUM: 139 meq/L (ref 135–145)
TOTAL PROTEIN: 7 g/dL (ref 6.0–8.3)
Total Bilirubin: 0.5 mg/dL (ref 0.2–1.2)

## 2017-01-11 MED ORDER — OMEPRAZOLE 40 MG PO CPDR: 40.0000 mg | DELAYED_RELEASE_CAPSULE | Freq: Every day | ORAL | 11 refills | Status: DC

## 2017-01-11 MED ORDER — TADALAFIL 5 MG PO TABS: 5.0000 mg | ORAL_TABLET | Freq: Every day | ORAL | 11 refills | Status: DC | PRN

## 2017-01-11 NOTE — Assessment & Plan Note (Signed)
Due for repeat colonoscopy 2019

## 2017-01-11 NOTE — Patient Instructions (Signed)

## 2017-01-11 NOTE — Progress Notes (Signed)
Subjective:    Patient ID: Alex Carr, male    DOB: [birth year], 51 y.o.   MRN: [id number]  HPI  Pt presents to the clinic today for his annual exam. He is also due to follow up chronic conditions.  GERD: He is not sure what triggers this. He reports breakthrough symptoms on Prilosec. He does take Tums daily.  Erectile Dysfunction: He is taking Cialis only as needed. He reports it works well for him.  Flu: 09/2016 Tetanus: 11/2015 Colon Screening: 12/2014, every 3 years Vision Screening: annually 06/2016 Dentist: yearly  Diet: He does eat a little meat. He consumes fruits and veggies daily. He rarely eats fried foods. He drinks mostly water and unsweet tea. Exercise: He walks for 30 minutes daily  Review of Systems  Past Medical History:  Diagnosis Date  . Chicken pox   . Colon cancer ([place]) 2000  . History of stomach ulcers   . Hx of colonic polyps   . Kidney stones     Current Outpatient Prescriptions  Medication Sig Dispense Refill  . aspirin EC 81 MG tablet Take 1 tablet by mouth daily.    . Multiple Vitamin (MULTI-VITAMINS) TABS Take 1 tablet by mouth.    Marland Kitchen omeprazole (PRILOSEC) 20 MG capsule Take 1 capsule (20 mg total) by mouth daily. MUST SCHEDULE ANNUAL EXAM 90 capsule 0  . ondansetron (ZOFRAN) 4 MG tablet Take 1 tablet (4 mg total) by mouth every 8 (eight) hours as needed. 20 tablet 0  . tadalafil (CIALIS) 5 MG tablet Take 1 tablet (5 mg total) by mouth daily as needed for erectile dysfunction. MUST SCHEDULE ANNUAL PHYSICAL FOR January 2018 30 tablet 0   No current facility-administered medications for this visit.     Allergies  Allergen Reactions  . Fluconazole Rash    Family History  Problem Relation Age of Onset  . Cancer Father     Cancerous Polyps  . Heart disease Father   . Hyperlipidemia Father   . Cancer Paternal Grandfather     Lung    Social History   Social History  . Marital status: Married    Spouse name: N/A  . Number of  children: N/A  . Years of education: N/A   Occupational History  . Not on file.   Social History Main Topics  . Smoking status: Former Research scientist (life sciences)  . Smokeless tobacco: Never Used     Comment: Quit in 1999  . Alcohol use No  . Drug use: No  . Sexual activity: Yes   Other Topics Concern  . Not on file   Social History Narrative  . No narrative on file     Constitutional: Denies fever, malaise, fatigue, headache or abrupt weight changes.  HEENT: Denies eye pain, eye redness, ear pain, ringing in the ears, wax buildup, runny nose, nasal congestion, bloody nose, or sore throat. Respiratory: Denies difficulty breathing, shortness of breath, cough or sputum production.   Cardiovascular: Denies chest pain, chest tightness, palpitations or swelling in the hands or feet.  Gastrointestinal: Pt reports reflux. Denies abdominal pain, bloating, constipation, diarrhea or blood in the stool.  GU: Denies urgency, frequency, pain with urination, burning sensation, blood in urine, odor or discharge. Musculoskeletal: Denies decrease in range of motion, difficulty with gait, muscle pain or joint pain and swelling.  Skin: Pt reports knot on his back. Denies redness, rashes, or ulcercations.  Neurological: Denies dizziness, difficulty with memory, difficulty with speech or problems with balance and coordination.  Psych:  Denies anxiety, depression, SI/HI.  No other specific complaints in a complete review of systems (except as listed in HPI above).     Objective:   Physical Exam   BP 120/78   Pulse 70   Temp 97.9 F (36.6 C) (Oral)   Ht 5' 8.5" (1.74 m)   Wt 206 lb (93.4 kg)   SpO2 98%   BMI 30.87 kg/m  Wt Readings from Last 3 Encounters:  01/11/17 206 lb (93.4 kg)  07/28/16 209 lb 8 oz (95 kg)  12/19/15 198 lb (89.8 kg)    General: Appears his stated age, obese in NAD. Skin: 1 cm round sebaceous cyst noted on upper back.  HEENT: Head: normal shape and size; Eyes: sclera white, no  icterus, conjunctiva pink, PERRLA and EOMs intact; Ears: Tm's gray and intact, normal light reflex; Throat/Mouth: Teeth present, mucosa pink and moist, no exudate, lesions or ulcerations noted.  Neck:  Neck supple, trachea midline. No masses, lumps or thyromegaly present.  Cardiovascular: Normal rate and rhythm. S1,S2 noted.  No murmur, rubs or gallops noted. No JVD or BLE edema. No carotid bruits noted. Pulmonary/Chest: Normal effort and positive vesicular breath sounds. No respiratory distress. No wheezes, rales or ronchi noted.  Abdomen: Soft and nontender. Normal bowel sounds. No distention or masses noted. Liver, spleen and kidneys non palpable. Musculoskeletal: Normal range of motion. Strength 5/5 BUE/BLE. No difficulty with gait.  Neurological: Alert and oriented. Cranial nerves II-XII grossly intact. Coordination normal.  Psychiatric: Mood and affect normal. Behavior is normal. Judgment and thought content normal.     BMET    Component Value Date/Time   NA 138 07/28/2016 1001   NA 138 11/21/2014 1758   K 4.6 07/28/2016 1001   K 3.9 11/21/2014 1758   CL 103 07/28/2016 1001   CL 105 11/21/2014 1758   CO2 28 07/28/2016 1001   CO2 25 11/21/2014 1758   GLUCOSE 115 (H) 07/28/2016 1001   GLUCOSE 88 11/21/2014 1758   BUN 12 07/28/2016 1001   BUN 14 11/21/2014 1758   CREATININE 0.97 07/28/2016 1001   CREATININE 0.89 11/21/2014 1758   CALCIUM 9.9 07/28/2016 1001   CALCIUM 8.9 11/21/2014 1758   GFRNONAA >60 11/21/2014 1758   GFRAA >60 11/21/2014 1758    Lipid Panel     Component Value Date/Time   CHOL 158 07/25/2015   TRIG 70 07/25/2015   HDL 66 07/25/2015   LDLCALC 78 07/25/2015    CBC    Component Value Date/Time   WBC 6.1 07/28/2016 1001   RBC 5.01 07/28/2016 1001   HGB 15.3 07/28/2016 1001   HGB 13.9 11/21/2014 1758   HCT 45.1 07/28/2016 1001   HCT 41.9 11/21/2014 1758   PLT 209.0 07/28/2016 1001   PLT 229 11/21/2014 1758   MCV 89.9 07/28/2016 1001   MCV 92  11/21/2014 1758   MCH 30.6 11/21/2014 1758   MCHC 33.9 07/28/2016 1001   RDW 14.1 07/28/2016 1001   RDW 13.1 11/21/2014 1758   LYMPHSABS 1.8 11/21/2014 1758   MONOABS 0.6 11/21/2014 1758   EOSABS 0.1 11/21/2014 1758   BASOSABS 0.0 11/21/2014 1758    Hgb A1C Lab Results  Component Value Date   HGBA1C 5.4 07/25/2015           Assessment & Plan:   Preventative Health Maintenance:  Flu, tetanus and pneumovax UTD Colon Screening UTD Encouraged him to consume a balanced diet and exercise regimen Advised him to see an eye doctor and  dentist annually Will check CBC, CMET, Lipid, PSA and HIV today  RTC in 1 year, sooner if needed Webb Silversmith, NP

## 2017-01-11 NOTE — Assessment & Plan Note (Signed)
Deteriorated Increase Prilosec to 40 mg daily, eRx sent to pharmacy

## 2017-01-11 NOTE — Assessment & Plan Note (Signed)
Controlled with Cialis prn Medication refilled today

## 2017-01-12 LAB — HIV ANTIBODY (ROUTINE TESTING W REFLEX): HIV: NONREACTIVE

## 2017-01-18 ENCOUNTER — Encounter: Payer: Self-pay | Admitting: Family Medicine

## 2017-01-18 ENCOUNTER — Ambulatory Visit (INDEPENDENT_AMBULATORY_CARE_PROVIDER_SITE_OTHER): Payer: BLUE CROSS/BLUE SHIELD | Admitting: Family Medicine

## 2017-01-18 DIAGNOSIS — M545 Low back pain, unspecified: Secondary | ICD-10-CM | POA: Insufficient documentation

## 2017-01-18 MED ORDER — DICLOFENAC SODIUM 75 MG PO TBEC: 75.0000 mg | DELAYED_RELEASE_TABLET | Freq: Two times a day (BID) | ORAL | 0 refills | Status: DC

## 2017-01-18 MED ORDER — CYCLOBENZAPRINE HCL 10 MG PO TABS: 5.0000 mg | ORAL_TABLET | Freq: Every evening | ORAL | 0 refills | Status: DC | PRN

## 2017-01-18 NOTE — Assessment & Plan Note (Signed)
Acute MSK strain. Treat with NSAIDs, heat, home PT, and muscle relaxant prn.   if not improving in 2 week consider X-ray and referral to PT.

## 2017-01-18 NOTE — Patient Instructions (Signed)
Start diclofenac twice daily for pain and inflammation. Do not take ibuprofen , aleve with this med.  Take on full stomach.  can use muscle relaxant as needed at night.  Apply heat and start home physical therapy/gentle stretching. If not improving in 2 week follow up for further eval and treatment.

## 2017-01-18 NOTE — Progress Notes (Signed)
   Subjective:    Patient ID: Jaeshaun Perret, male    DOB: [birth year], 51 y.o.   MRN: [id number]  HPI  51 year old male presents with new r onset pain in  Right low back iIn last 3 days.  Sudden onset pain in right low back, tightness. Gradually worse during the day. No fall, no injury, no new change in activity.  Pain is 2/10 at rest sitting, but when walking pain or moving right leg.. sharp pain shoots 10/10. No radiation of pain into legs.  No numbness, no weakness.  No fever, no incontinence, no dysuria.  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  Walks a lot but not lifting.  He has tried ibuprofen 800 mg .. Did not help much.  Hx: no history of  Back issues, surgery or injections.   Review of Systems  Constitutional: Negative for fatigue and fever.  HENT: Negative for ear pain.   Eyes: Negative for pain.  Respiratory: Negative for cough and shortness of breath.   Cardiovascular: Negative for chest pain.       Objective:   Physical Exam  Constitutional: Vital signs are normal. He appears well-developed and well-nourished.  HENT:  Head: Normocephalic.  Right Ear: Hearing normal.  Left Ear: Hearing normal.  Nose: Nose normal.  Mouth/Throat: Oropharynx is clear and moist and mucous membranes are normal.  Neck: Trachea normal. Carotid bruit is not present. No thyroid mass and no thyromegaly present.  Cardiovascular: Normal rate, regular rhythm and normal pulses.  Exam reveals no gallop, no distant heart sounds and no friction rub.   No murmur heard. No peripheral edema  Pulmonary/Chest: Effort normal and breath sounds normal. No respiratory distress.  Musculoskeletal:       Thoracic back: Normal.       Lumbar back: He exhibits decreased range of motion. He exhibits no tenderness, no bony tenderness and no swelling.  Neg SLR, neg fabers bilaterally  Neurological: He has normal strength. He displays no atrophy. He exhibits normal muscle tone. He displays a negative Romberg  sign. Gait abnormal. Coordination normal.  Skin: Skin is warm, dry and intact. No rash noted.  Psychiatric: He has a normal mood and affect. His speech is normal and behavior is normal. Thought content normal.          Assessment & Plan:

## 2017-01-18 NOTE — Progress Notes (Signed)
Pre visit review using our clinic review tool, if applicable. No additional management support is needed unless otherwise documented below in the visit note. 

## 2017-08-05 ENCOUNTER — Ambulatory Visit (INDEPENDENT_AMBULATORY_CARE_PROVIDER_SITE_OTHER): Payer: 59 | Admitting: Internal Medicine

## 2017-08-05 ENCOUNTER — Ambulatory Visit (INDEPENDENT_AMBULATORY_CARE_PROVIDER_SITE_OTHER)
Admission: RE | Admit: 2017-08-05 | Discharge: 2017-08-05 | Disposition: A | Discharge status: Home / Self Care | Payer: 59 | Source: Ambulatory Visit | Attending: Internal Medicine | Admitting: Internal Medicine

## 2017-08-05 ENCOUNTER — Encounter: Payer: Self-pay | Admitting: Internal Medicine

## 2017-08-05 VITALS — BP 124/84 | HR 68 | Temp 98.0°F | Wt 210.0 lb

## 2017-08-05 DIAGNOSIS — M545 Low back pain, unspecified: Secondary | ICD-10-CM

## 2017-08-05 MED ORDER — NAPROXEN 375 MG PO TABS: 375.0000 mg | ORAL_TABLET | Freq: Two times a day (BID) | ORAL | 2 refills | Status: DC

## 2017-08-05 NOTE — Progress Notes (Signed)
Subjective:    Patient ID: Alex Carr, male    DOB: 04/18/1966, 51 y.o.   MRN: [id number]  HPI  Pt presents to the clinic today with c/o low back pain. He reports this started 1 month ago. He describes the pain as sharp in the morning, but achy throughout the day. He has associated muscle tightness. The pain does not radiate. He denies numbness or tingling in his legs. He denies loss of bowel or bladder. He denies any injury to the area. He has not tried anything OTC for this.  Review of Systems   Past Medical History:  Diagnosis Date  . Chicken pox   . Colon cancer ([place]) 2000  . History of stomach ulcers   . Hx of colonic polyps   . Kidney stones     Current Outpatient Prescriptions  Medication Sig Dispense Refill  . aspirin EC 81 MG tablet Take 1 tablet by mouth daily.    . cyclobenzaprine (FLEXERIL) 10 MG tablet Take 0.5-1 tablets (5-10 mg total) by mouth at bedtime as needed for muscle spasms. 15 tablet 0  . diclofenac (VOLTAREN) 75 MG EC tablet Take 1 tablet (75 mg total) by mouth 2 (two) times daily. 30 tablet 0  . Multiple Vitamin (MULTI-VITAMINS) TABS Take 1 tablet by mouth.    Marland Kitchen omeprazole (PRILOSEC) 40 MG capsule Take 1 capsule (40 mg total) by mouth daily. 30 capsule 11  . tadalafil (CIALIS) 5 MG tablet Take 1 tablet (5 mg total) by mouth daily as needed for erectile dysfunction. 30 tablet 11   No current facility-administered medications for this visit.     Allergies  Allergen Reactions  . Fluconazole Rash    Family History  Problem Relation Age of Onset  . Cancer Father        Cancerous Polyps  . Heart disease Father   . Hyperlipidemia Father   . Cancer Paternal Grandfather        Lung    Social History   Social History  . Marital status: Married    Spouse name: N/A  . Number of children: N/A  . Years of education: N/A   Occupational History  . Not on file.   Social History Main Topics  . Smoking status: Former Research scientist (life sciences)  . Smokeless  tobacco: Never Used     Comment: Quit in 1999  . Alcohol use No  . Drug use: No  . Sexual activity: Yes   Other Topics Concern  . Not on file   Social History Narrative  . No narrative on file     Constitutional: Denies fever, malaise, fatigue, headache or abrupt weight changes.  Musculoskeletal: Pt reports back pain. Denies decrease in range of motion, difficulty with gait, or joint swelling.   No other specific complaints in a complete review of systems (except as listed in HPI above).   Objective:   Physical Exam  BP 124/84   Pulse 68   Temp 98 F (36.7 C) (Oral)   Wt 210 lb (95.3 kg)   SpO2 98%   BMI 31.47 kg/m  Wt Readings from Last 3 Encounters:  08/05/17 210 lb (95.3 kg)  01/18/17 209 lb 12 oz (95.1 kg)  01/11/17 206 lb (93.4 kg)    General: Appears his stated age, obese in NAD. Musculoskeletal: Normal flexion, extension and rotation of the spine. Mild bony tenderness noted over the lumbar spine. Strength 5/5 BLE.  No difficulty with gait.    BMET    Component  Value Date/Time   NA 139 01/11/2017 1520   NA 138 11/21/2014 1758   K 4.3 01/11/2017 1520   K 3.9 11/21/2014 1758   CL 105 01/11/2017 1520   CL 105 11/21/2014 1758   CO2 29 01/11/2017 1520   CO2 25 11/21/2014 1758   GLUCOSE 90 01/11/2017 1520   GLUCOSE 88 11/21/2014 1758   BUN 13 01/11/2017 1520   BUN 14 11/21/2014 1758   CREATININE 0.86 01/11/2017 1520   CREATININE 0.89 11/21/2014 1758   CALCIUM 9.6 01/11/2017 1520   CALCIUM 8.9 11/21/2014 1758   GFRNONAA >60 11/21/2014 1758   GFRAA >60 11/21/2014 1758    Lipid Panel     Component Value Date/Time   CHOL 157 01/11/2017 1520   TRIG 68.0 01/11/2017 1520   HDL 63.50 01/11/2017 1520   CHOLHDL 2 01/11/2017 1520   VLDL 13.6 01/11/2017 1520   LDLCALC 80 01/11/2017 1520    CBC    Component Value Date/Time   WBC 4.9 01/11/2017 1520   RBC 4.49 01/11/2017 1520   HGB 13.5 01/11/2017 1520   HGB 13.9 11/21/2014 1758   HCT 39.5  01/11/2017 1520   HCT 41.9 11/21/2014 1758   PLT 215.0 01/11/2017 1520   PLT 229 11/21/2014 1758   MCV 88.0 01/11/2017 1520   MCV 92 11/21/2014 1758   MCH 30.6 11/21/2014 1758   MCHC 34.2 01/11/2017 1520   RDW 14.2 01/11/2017 1520   RDW 13.1 11/21/2014 1758   LYMPHSABS 1.8 11/21/2014 1758   MONOABS 0.6 11/21/2014 1758   EOSABS 0.1 11/21/2014 1758   BASOSABS 0.0 11/21/2014 1758    Hgb A1C Lab Results  Component Value Date   HGBA1C 5.4 07/25/2015            Assessment & Plan:   Low Back Pain:  Xray lumbar spine today Encouraged heat Back exercises given eRx for Naproxen 375 mg BID, make sure you take your Prilosec daily  Will follow up after xray, return precautions discussed Webb Silversmith, NP

## 2017-08-05 NOTE — Patient Instructions (Signed)

## 2017-08-24 ENCOUNTER — Ambulatory Visit (INDEPENDENT_AMBULATORY_CARE_PROVIDER_SITE_OTHER): Payer: 59 | Admitting: Internal Medicine

## 2017-08-24 ENCOUNTER — Encounter: Payer: Self-pay | Admitting: Internal Medicine

## 2017-08-24 VITALS — BP 126/84 | HR 72 | Temp 98.1°F | Wt 214.0 lb

## 2017-08-24 DIAGNOSIS — L02212 Cutaneous abscess of back [any part, except buttock]: Secondary | ICD-10-CM

## 2017-08-24 MED ORDER — SULFAMETHOXAZOLE-TRIMETHOPRIM 800-160 MG PO TABS: 1.0000 | ORAL_TABLET | Freq: Two times a day (BID) | ORAL | 0 refills | Status: DC

## 2017-08-24 NOTE — Patient Instructions (Signed)
Incision and Drainage, Care After  Refer to this sheet in the next few weeks. These instructions provide you with information about caring for yourself after your procedure. Your health care provider may also give you more specific instructions. Your treatment has been planned according to current medical practices, but problems sometimes occur. Call your health care provider if you have any problems or questions after your procedure.  What can I expect after the procedure?  After the procedure, it is common to have:  · Pain or discomfort around your incision site.  · Drainage from your incision.     Follow these instructions at home:  ·   · Take over-the-counter and prescription medicines only as told by your health care provider.  · If you were prescribed an antibiotic medicine, take it as told by your health care provider. Do not stop taking the antibiotic even if you start to feel better.  · Follow instructions from your health care provider about:  ? How to take care of your incision.  ? When and how you should change your packing and bandage (dressing). Wash your hands with soap and water before you change your dressing. If soap and water are not available, use hand sanitizer.  ? When you should remove your dressing.  · Do not take baths, swim, or use a hot tub until your health care provider approves.  · Keep all follow-up visits as told by your health care provider. This is important.  · Check your incision area every day for signs of infection. Check for:  ? More redness, swelling, or pain.  ? More fluid or blood.  ? Warmth.  ? Pus or a bad smell.  Contact a health care provider if:  · Your cyst or abscess returns.  · You have a fever.  · You have more redness, swelling, or pain around your incision.  · You have more fluid or blood coming from your incision.  · Your incision feels warm to the touch.  · You have pus or a bad smell coming from your incision.  Get help right away if:  · You have severe pain or  bleeding.  · You cannot eat or drink without vomiting.  · You have decreased urine output.  · You become short of breath.  · You have chest pain.  · You cough up blood.  · The area where the incision and drainage occurred becomes numb or it tingles.  This information is not intended to replace advice given to you by your health care provider. Make sure you discuss any questions you have with your health care provider.  Document Released: 02/29/2012 Document Revised: 05/08/2016 Document Reviewed: 09/27/2015  Elsevier Interactive Patient Education © 2017 Elsevier Inc.   

## 2017-08-24 NOTE — Addendum Note (Signed)
Addended by: Lurlean Nanny on: 08/24/2017 10:25 AM   Modules accepted: Orders

## 2017-08-24 NOTE — Progress Notes (Signed)
Subjective:    Patient ID: Alex Carr, male    DOB: [birth year], 51 y.o.   MRN: [id number]  HPI  Pt presents to the clinic today with c/o a boil on his back.  He noticed this 1 week ago. He reports there has been a cyst there for month, but recently it became swollen, red and painful. He has noticed discharge from the area. He denies fever, chills or body aches. He has not tried anything OTC.  Review of Systems      Past Medical History:  Diagnosis Date  . Chicken pox   . Colon cancer ([place]) 2000  . History of stomach ulcers   . Hx of colonic polyps   . Kidney stones     Current Outpatient Prescriptions  Medication Sig Dispense Refill  . aspirin EC 81 MG tablet Take 1 tablet by mouth daily.    . cyclobenzaprine (FLEXERIL) 10 MG tablet Take 0.5-1 tablets (5-10 mg total) by mouth at bedtime as needed for muscle spasms. (Patient not taking: Reported on 08/05/2017) 15 tablet 0  . Multiple Vitamin (MULTI-VITAMINS) TABS Take 1 tablet by mouth.    . naproxen (NAPROSYN) 375 MG tablet Take 1 tablet (375 mg total) by mouth 2 (two) times daily with a meal. 60 tablet 2  . omeprazole (PRILOSEC) 40 MG capsule Take 1 capsule (40 mg total) by mouth daily. 30 capsule 11  . tadalafil (CIALIS) 5 MG tablet Take 1 tablet (5 mg total) by mouth daily as needed for erectile dysfunction. 30 tablet 11   No current facility-administered medications for this visit.     Allergies  Allergen Reactions  . Fluconazole Rash    Family History  Problem Relation Age of Onset  . Cancer Father        Cancerous Polyps  . Heart disease Father   . Hyperlipidemia Father   . Cancer Paternal Grandfather        Lung    Social History   Social History  . Marital status: Married    Spouse name: N/A  . Number of children: N/A  . Years of education: N/A   Occupational History  . Not on file.   Social History Main Topics  . Smoking status: Former Research scientist (life sciences)  . Smokeless tobacco: Never Used     Comment:  Quit in 1999  . Alcohol use No  . Drug use: No  . Sexual activity: Yes   Other Topics Concern  . Not on file   Social History Narrative  . No narrative on file     Constitutional: Denies fever, malaise, fatigue, headache or abrupt weight changes.  Skin: Pt reports boil. Denies ulcercations.    No other specific complaints in a complete review of systems (except as listed in HPI above).  Objective:   Physical Exam   BP 126/84   Pulse 72   Temp 98.1 F (36.7 C) (Oral)   Wt 214 lb (97.1 kg)   SpO2 97%   BMI 32.07 kg/m  Wt Readings from Last 3 Encounters:  08/24/17 214 lb (97.1 kg)  08/05/17 210 lb (95.3 kg)  01/18/17 209 lb 12 oz (95.1 kg)    General: Appears his stated age, well developed, well nourished in NAD. Skin: 3 cm x 3 cm abscess with multiple heads noted on midline upper back, non fluctuant.  BMET    Component Value Date/Time   NA 139 01/11/2017 1520   NA 138 11/21/2014 1758   K 4.3 01/11/2017 1520  K 3.9 11/21/2014 1758   CL 105 01/11/2017 1520   CL 105 11/21/2014 1758   CO2 29 01/11/2017 1520   CO2 25 11/21/2014 1758   GLUCOSE 90 01/11/2017 1520   GLUCOSE 88 11/21/2014 1758   BUN 13 01/11/2017 1520   BUN 14 11/21/2014 1758   CREATININE 0.86 01/11/2017 1520   CREATININE 0.89 11/21/2014 1758   CALCIUM 9.6 01/11/2017 1520   CALCIUM 8.9 11/21/2014 1758   GFRNONAA >60 11/21/2014 1758   GFRAA >60 11/21/2014 1758    Lipid Panel     Component Value Date/Time   CHOL 157 01/11/2017 1520   TRIG 68.0 01/11/2017 1520   HDL 63.50 01/11/2017 1520   CHOLHDL 2 01/11/2017 1520   VLDL 13.6 01/11/2017 1520   LDLCALC 80 01/11/2017 1520    CBC    Component Value Date/Time   WBC 4.9 01/11/2017 1520   RBC 4.49 01/11/2017 1520   HGB 13.5 01/11/2017 1520   HGB 13.9 11/21/2014 1758   HCT 39.5 01/11/2017 1520   HCT 41.9 11/21/2014 1758   PLT 215.0 01/11/2017 1520   PLT 229 11/21/2014 1758   MCV 88.0 01/11/2017 1520   MCV 92 11/21/2014 1758   MCH  30.6 11/21/2014 1758   MCHC 34.2 01/11/2017 1520   RDW 14.2 01/11/2017 1520   RDW 13.1 11/21/2014 1758   LYMPHSABS 1.8 11/21/2014 1758   MONOABS 0.6 11/21/2014 1758   EOSABS 0.1 11/21/2014 1758   BASOSABS 0.0 11/21/2014 1758    Hgb A1C Lab Results  Component Value Date   HGBA1C 5.4 07/25/2015           Assessment & Plan:  Abscess of Back:   Abscess I&D, see procedure note Tylenol as needed for pain Warm compresses TID eRx for Septra BID x 14 days  Procedure Note:  Discussed risk and benefits of procedure Verbal informed consent obtained, his wife was a witness Area cleansed with Betadine Area numbed with PainEase spray Area incised with #11 blade, some pus and blood expressed Area cleaned with sterile water, covered with TAB and dressing Aftercare instructions given  Return precautions discussed Webb Silversmith, NP

## 2017-09-10 ENCOUNTER — Other Ambulatory Visit: Payer: Self-pay

## 2017-09-10 DIAGNOSIS — K219 Gastro-esophageal reflux disease without esophagitis: Secondary | ICD-10-CM

## 2017-09-10 MED ORDER — NAPROXEN 375 MG PO TABS: 375.0000 mg | ORAL_TABLET | Freq: Two times a day (BID) | ORAL | 0 refills | Status: DC

## 2017-09-10 MED ORDER — OMEPRAZOLE 40 MG PO CPDR: 40.0000 mg | DELAYED_RELEASE_CAPSULE | Freq: Every day | ORAL | 0 refills | Status: DC

## 2017-09-16 NOTE — Telephone Encounter (Signed)
[facility] called to verify supervising physician for Alex Echevaria NP; Dr Arnette Norris and her NPI # was given. Nothing further needed. Ref # [id number].

## 2017-09-28 ENCOUNTER — Other Ambulatory Visit: Payer: Self-pay | Admitting: Internal Medicine

## 2017-10-25 ENCOUNTER — Other Ambulatory Visit: Payer: Self-pay

## 2017-10-25 DIAGNOSIS — K219 Gastro-esophageal reflux disease without esophagitis: Secondary | ICD-10-CM

## 2017-10-25 MED ORDER — OMEPRAZOLE 40 MG PO CPDR: 40.0000 mg | DELAYED_RELEASE_CAPSULE | Freq: Every day | ORAL | 0 refills | Status: DC

## 2017-10-25 MED ORDER — NAPROXEN 375 MG PO TABS: 375.0000 mg | ORAL_TABLET | Freq: Two times a day (BID) | ORAL | 0 refills | Status: DC

## 2017-12-27 ENCOUNTER — Encounter: Payer: Self-pay | Admitting: Internal Medicine

## 2018-01-29 ENCOUNTER — Other Ambulatory Visit: Payer: Self-pay | Admitting: Internal Medicine

## 2018-02-16 ENCOUNTER — Other Ambulatory Visit: Payer: Self-pay | Admitting: Internal Medicine

## 2018-02-16 DIAGNOSIS — K219 Gastro-esophageal reflux disease without esophagitis: Secondary | ICD-10-CM

## 2018-03-09 ENCOUNTER — Ambulatory Visit (INDEPENDENT_AMBULATORY_CARE_PROVIDER_SITE_OTHER): Payer: 59 | Admitting: Internal Medicine

## 2018-03-09 ENCOUNTER — Ambulatory Visit (INDEPENDENT_AMBULATORY_CARE_PROVIDER_SITE_OTHER)
Admission: RE | Admit: 2018-03-09 | Discharge: 2018-03-09 | Disposition: A | Discharge status: Home / Self Care | Payer: 59 | Source: Ambulatory Visit | Attending: Internal Medicine | Admitting: Internal Medicine

## 2018-03-09 ENCOUNTER — Encounter: Payer: Self-pay | Admitting: Internal Medicine

## 2018-03-09 VITALS — BP 122/84 | HR 74 | Temp 98.0°F | Wt 217.0 lb

## 2018-03-09 DIAGNOSIS — L309 Dermatitis, unspecified: Secondary | ICD-10-CM | POA: Diagnosis not present

## 2018-03-09 DIAGNOSIS — N529 Male erectile dysfunction, unspecified: Secondary | ICD-10-CM | POA: Diagnosis not present

## 2018-03-09 DIAGNOSIS — M25562 Pain in left knee: Secondary | ICD-10-CM

## 2018-03-09 DIAGNOSIS — M25561 Pain in right knee: Secondary | ICD-10-CM

## 2018-03-09 MED ORDER — HYDROCORTISONE 2.5 % EX OINT: TOPICAL_OINTMENT | Freq: Two times a day (BID) | CUTANEOUS | 0 refills | Status: DC

## 2018-03-09 MED ORDER — PREDNISONE 10 MG PO TABS: ORAL_TABLET | ORAL | 0 refills | Status: DC

## 2018-03-09 MED ORDER — SILDENAFIL CITRATE 25 MG PO TABS: 25.0000 mg | ORAL_TABLET | Freq: Every day | ORAL | 0 refills | Status: DC | PRN

## 2018-03-09 NOTE — Patient Instructions (Signed)

## 2018-03-12 ENCOUNTER — Encounter: Payer: Self-pay | Admitting: Internal Medicine

## 2018-03-12 NOTE — Progress Notes (Signed)
Subjective:    Patient ID: Alex Carr, male    DOB: January 02, 1966, 52 y.o.   MRN: [id number]  HPI  Pt presents to the clinic today with c/o bilateral knee pain. This started months ago, but he reports it is much worse in the last 3-4 weeks. The left seems worse than the right. He describes the pain as sharp and stabbing. He has also noticed some stiffness and weakness, especially in the morning. He denies any injury to the area. He has tried Naproxen without any relief.  He also reports he would like to try generic Viagra instead of Cialis due to cost.  He would also like a RX for Hydrocortisone 2.5% for his eczema. He reports the OTC is not enough.  Review of Systems   Past Medical History:  Diagnosis Date  . Chicken pox   . Colon cancer ([place]) 2000  . History of stomach ulcers   . Hx of colonic polyps   . Kidney stones     Current Outpatient Medications  Medication Sig Dispense Refill  . aspirin EC 81 MG tablet Take 1 tablet by mouth daily.    . Multiple Vitamin (MULTI-VITAMINS) TABS Take 1 tablet by mouth.    . naproxen (NAPROSYN) 375 MG tablet TAKE 1 TABLET BY MOUTH  TWICE A DAY WITH A MEAL 180 tablet 0  . omeprazole (PRILOSEC) 40 MG capsule TAKE 1 CAPSULE BY MOUTH  DAILY 90 capsule 0  . tadalafil (CIALIS) 5 MG tablet Take 1 tablet (5 mg total) by mouth daily as needed for erectile dysfunction. 30 tablet 11  . hydrocortisone 2.5 % ointment Apply topically 2 (two) times daily. 30 g 0  . predniSONE (DELTASONE) 10 MG tablet Take 3 tabs on days 1-3, take 2 tabs on days 4-6, take 1 tab on days 7-9 18 tablet 0  . sildenafil (VIAGRA) 25 MG tablet Take 1 tablet (25 mg total) by mouth daily as needed for erectile dysfunction. 10 tablet 0   No current facility-administered medications for this visit.     Allergies  Allergen Reactions  . Fluconazole Rash    Family History  Problem Relation Age of Onset  . Cancer Father        Cancerous Polyps  . Heart disease Father   .  Hyperlipidemia Father   . Cancer Paternal Grandfather        Lung    Social History   Socioeconomic History  . Marital status: Married    Spouse name: Not on file  . Number of children: Not on file  . Years of education: Not on file  . Highest education level: Not on file  Occupational History  . Not on file  Social Needs  . Financial resource strain: Not on file  . Food insecurity:    Worry: Not on file    Inability: Not on file  . Transportation needs:    Medical: Not on file    Non-medical: Not on file  Tobacco Use  . Smoking status: Former Research scientist (life sciences)  . Smokeless tobacco: Never Used  . Tobacco comment: Quit in 1999  Substance and Sexual Activity  . Alcohol use: No  . Drug use: No  . Sexual activity: Yes  Lifestyle  . Physical activity:    Days per week: Not on file    Minutes per session: Not on file  . Stress: Not on file  Relationships  . Social connections:    Talks on phone: Not on file  Gets together: Not on file    Attends religious service: Not on file    Active member of club or organization: Not on file    Attends meetings of clubs or organizations: Not on file    Relationship status: Not on file  . Intimate partner violence:    Fear of current or ex partner: Not on file    Emotionally abused: Not on file    Physically abused: Not on file    Forced sexual activity: Not on file  Other Topics Concern  . Not on file  Social History Narrative  . Not on file     Constitutional: Denies fever, malaise, fatigue, headache or abrupt weight changes.  GU: Pt reports erectile dysfunction. Denies urgency, frequency, pain with urination, burning sensation, blood in urine, odor or discharge. Musculoskeletal: Pt reports bilateral knee pain. Denies decrease in range of motion, difficulty with gait, muscle pain or joint swelling.    No other specific complaints in a complete review of systems (except as listed in HPI above).  Objective:   Physical Exam  BP  122/84   Pulse 74   Temp 98 F (36.7 C) (Oral)   Wt 217 lb (98.4 kg)   SpO2 96%   BMI 32.51 kg/m  Wt Readings from Last 3 Encounters:  03/09/18 217 lb (98.4 kg)  08/24/17 214 lb (97.1 kg)  08/05/17 210 lb (95.3 kg)    General: Appears his stated age, obese in NAD. Musculoskeletal: Normal flexion and extension of bilateral knees. No crepitus noted with ROM. Left knee joint enlarged. Pain with palpation over bilateral medial joint lines. No lower extremity edema. Gait steady.   BMET    Component Value Date/Time   NA 139 01/11/2017 1520   NA 138 11/21/2014 1758   K 4.3 01/11/2017 1520   K 3.9 11/21/2014 1758   CL 105 01/11/2017 1520   CL 105 11/21/2014 1758   CO2 29 01/11/2017 1520   CO2 25 11/21/2014 1758   GLUCOSE 90 01/11/2017 1520   GLUCOSE 88 11/21/2014 1758   BUN 13 01/11/2017 1520   BUN 14 11/21/2014 1758   CREATININE 0.86 01/11/2017 1520   CREATININE 0.89 11/21/2014 1758   CALCIUM 9.6 01/11/2017 1520   CALCIUM 8.9 11/21/2014 1758   GFRNONAA >60 11/21/2014 1758   GFRAA >60 11/21/2014 1758    Lipid Panel     Component Value Date/Time   CHOL 157 01/11/2017 1520   TRIG 68.0 01/11/2017 1520   HDL 63.50 01/11/2017 1520   CHOLHDL 2 01/11/2017 1520   VLDL 13.6 01/11/2017 1520   LDLCALC 80 01/11/2017 1520    CBC    Component Value Date/Time   WBC 4.9 01/11/2017 1520   RBC 4.49 01/11/2017 1520   HGB 13.5 01/11/2017 1520   HGB 13.9 11/21/2014 1758   HCT 39.5 01/11/2017 1520   HCT 41.9 11/21/2014 1758   PLT 215.0 01/11/2017 1520   PLT 229 11/21/2014 1758   MCV 88.0 01/11/2017 1520   MCV 92 11/21/2014 1758   MCH 30.6 11/21/2014 1758   MCHC 34.2 01/11/2017 1520   RDW 14.2 01/11/2017 1520   RDW 13.1 11/21/2014 1758   LYMPHSABS 1.8 11/21/2014 1758   MONOABS 0.6 11/21/2014 1758   EOSABS 0.1 11/21/2014 1758   BASOSABS 0.0 11/21/2014 1758    Hgb A1C Lab Results  Component Value Date   HGBA1C 5.4 07/25/2015            Assessment & Plan:  Erectile Dysfunction:  D/c Cialis eRx for Sildenafil 25 mg daily prn  Bilateral Knee Pain:  Xray bilateral knees today eRx for Pred Taper x 9 days- hold Naproxen, take Tylenol if needed May need ortho referral Discussed how weight loss may help improve joint pain.   Eczema:  eRx for Hydrocortisone Ointment 2.5% prn BID  Will follow up after xrays, return precautions discussed Webb Silversmith, NP

## 2018-04-05 ENCOUNTER — Other Ambulatory Visit: Payer: Self-pay | Admitting: Internal Medicine

## 2018-04-05 DIAGNOSIS — K219 Gastro-esophageal reflux disease without esophagitis: Secondary | ICD-10-CM

## 2018-04-05 NOTE — Telephone Encounter (Signed)
Requesting refill sent to mail order, please advise

## 2018-04-06 MED ORDER — SILDENAFIL CITRATE 25 MG PO TABS: 25.0000 mg | ORAL_TABLET | Freq: Every day | ORAL | 0 refills | Status: DC | PRN

## 2018-04-16 ENCOUNTER — Other Ambulatory Visit: Payer: Self-pay

## 2018-04-16 ENCOUNTER — Emergency Department
Admission: EM | Admit: 2018-04-16 | Discharge: 2018-04-16 | Disposition: A | Discharge status: Home / Self Care | Payer: Worker's Compensation | Attending: Emergency Medicine | Admitting: Emergency Medicine

## 2018-04-16 ENCOUNTER — Encounter: Payer: Self-pay | Admitting: Emergency Medicine

## 2018-04-16 ENCOUNTER — Emergency Department: Payer: Worker's Compensation

## 2018-04-16 DIAGNOSIS — S46911A Strain of unspecified muscle, fascia and tendon at shoulder and upper arm level, right arm, initial encounter: Secondary | ICD-10-CM | POA: Insufficient documentation

## 2018-04-16 DIAGNOSIS — X500XXA Overexertion from strenuous movement or load, initial encounter: Secondary | ICD-10-CM | POA: Insufficient documentation

## 2018-04-16 DIAGNOSIS — Y999 Unspecified external cause status: Secondary | ICD-10-CM | POA: Insufficient documentation

## 2018-04-16 DIAGNOSIS — M25511 Pain in right shoulder: Secondary | ICD-10-CM | POA: Diagnosis present

## 2018-04-16 DIAGNOSIS — Y939 Activity, unspecified: Secondary | ICD-10-CM | POA: Diagnosis not present

## 2018-04-16 DIAGNOSIS — Y929 Unspecified place or not applicable: Secondary | ICD-10-CM | POA: Insufficient documentation

## 2018-04-16 DIAGNOSIS — Z79899 Other long term (current) drug therapy: Secondary | ICD-10-CM | POA: Insufficient documentation

## 2018-04-16 DIAGNOSIS — Z7982 Long term (current) use of aspirin: Secondary | ICD-10-CM | POA: Insufficient documentation

## 2018-04-16 DIAGNOSIS — D010 Carcinoma in situ of colon: Secondary | ICD-10-CM | POA: Insufficient documentation

## 2018-04-16 DIAGNOSIS — Z87891 Personal history of nicotine dependence: Secondary | ICD-10-CM | POA: Insufficient documentation

## 2018-04-16 MED ORDER — HYDROCODONE-ACETAMINOPHEN 5-325 MG PO TABS: 1.0000 | ORAL_TABLET | Freq: Four times a day (QID) | ORAL | 0 refills | Status: DC | PRN

## 2018-04-16 NOTE — ED Triage Notes (Signed)
Injured right shoulder at work yesterday when picking up a large can of propane, states he felt it "give." Pt sent here by employer, Hospital doctor.

## 2018-04-16 NOTE — Discharge Instructions (Addendum)
Call make an appointment with Dr. Sabra Heck who is the orthopedist on call.  Also ice and wear your sling for added support.  Norco every 6 hours as needed for moderate pain.  Continue taking your naproxen twice a day with food.  Do not drive or operate machinery while taking the pain medication as it could cause drowsiness and increased risk for injury.

## 2018-04-16 NOTE — ED Notes (Signed)
Per Charm Rings RN from [facility] stated that pt just needs to be treated and no UDS needs to be collected. #[phone] for any questions.

## 2018-04-16 NOTE — ED Provider Notes (Signed)
[facility] Emergency Department Provider Note  ____________________________________________   First MD Initiated Contact with Patient 04/16/18 1417     (approximate)  I have reviewed the triage vital signs and the nursing notes.   HISTORY  Chief Complaint Shoulder Pain  HPI Alex Carr is a 52 y.o. male is here with complaint of right shoulder pain that began at work yesterday after he picked up a large can of propane.  Patient states that the shoulder "gave way".  Patient states that last evening he had difficulty sleeping until he got a blanket under his arm to elevate his arm.  He denies any previous problems with his shoulder.  He has taken an anti-inflammatory for pain.  He states pain increases with range of motion.  He denies any previous rotator cuff injury.   Past Medical History:  Diagnosis Date  . Chicken pox   . Colon cancer ([place]) 2000  . History of stomach ulcers   . Hx of colonic polyps   . Kidney stones     Patient Active Problem List   Diagnosis Date Noted  . Gastroesophageal reflux disease 01/11/2017  . Colon cancer ([place]) 10/02/2014  . Erectile dysfunction 10/02/2014    Past Surgical History:  Procedure Laterality Date  . COLON SURGERY  2000  . FIBULA FRACTURE SURGERY     repair  . TONSILLECTOMY      Prior to Admission medications   Medication Sig Start Date End Date Taking? Authorizing Provider  aspirin EC 81 MG tablet Take 1 tablet by mouth daily.    [provider]  HYDROcodone-acetaminophen (NORCO/VICODIN) 5-325 MG tablet Take 1 tablet by mouth every 6 (six) hours as needed for moderate pain. 04/16/18   Johnn Hai, PA-C  hydrocortisone 2.5 % ointment Apply topically 2 (two) times daily. 03/09/18   Jearld Fenton, NP  Multiple Vitamin (MULTI-VITAMINS) TABS Take 1 tablet by mouth.    [provider]  naproxen (NAPROSYN) 375 MG tablet TAKE 1 TABLET BY MOUTH  TWICE A DAY WITH MEALS 04/05/18   Jearld Fenton, NP  omeprazole (PRILOSEC) 40 MG capsule TAKE 1 CAPSULE BY MOUTH  DAILY 04/05/18   Jearld Fenton, NP  predniSONE (DELTASONE) 10 MG tablet Take 3 tabs on days 1-3, take 2 tabs on days 4-6, take 1 tab on days 7-9 03/09/18   Jearld Fenton, NP  sildenafil (VIAGRA) 25 MG tablet Take 1 tablet (25 mg total) by mouth daily as needed for erectile dysfunction. 04/06/18   Jearld Fenton, NP    Allergies Fluconazole  Family History  Problem Relation Age of Onset  . Cancer Father        Cancerous Polyps  . Heart disease Father   . Hyperlipidemia Father   . Cancer Paternal Grandfather        Lung    Social History Social History   Tobacco Use  . Smoking status: Former Research scientist (life sciences)  . Smokeless tobacco: Never Used  . Tobacco comment: Quit in 1999  Substance Use Topics  . Alcohol use: No  . Drug use: No    Review of Systems Constitutional: No fever/chills Cardiovascular: Denies chest pain. Respiratory: Denies shortness of breath. Gastrointestinal:   No nausea, no vomiting.  Musculoskeletal: Negative for back pain.  Positive for right shoulder pain. Skin: Negative for rash. Neurological: Negative for headaches, focal weakness or numbness. ___________________________________________   PHYSICAL EXAM:  VITAL SIGNS: ED Triage Vitals  Enc Vitals Group  BP 04/16/18 1413 140/86     Pulse Rate 04/16/18 1413 80     Resp 04/16/18 1413 16     Temp 04/16/18 1413 97.8 F (36.6 C)     Temp Source 04/16/18 1413 Oral     SpO2 04/16/18 1413 96 %     Weight 04/16/18 1411 217 lb (98.4 kg)     Height 04/16/18 1411 5\' 10"  (1.778 m)     Head Circumference --      Peak Flow --      Pain Score 04/16/18 1411 0     Pain Loc --      Pain Edu? --      Excl. in [place]? --    Constitutional: Alert and oriented. Well appearing and in no acute distress. Eyes: Conjunctivae are normal.  Head: Atraumatic. Neck: No stridor.   Cardiovascular: Normal rate, regular rhythm. Grossly normal heart sounds.   Good peripheral circulation. Respiratory: Normal respiratory effort.  No retractions. Lungs CTAB. Musculoskeletal: Examination of the right shoulder there is no gross deformity however there is some minimal tenderness on palpation superior scapula muscles.  No crepitus is noted with range of motion and range is restricted in abduction secondary to discomfort.  There is some tenderness on palpation of the deltoid and minimal tenderness is noted in the [facility] joint area.  Pulses equal bilaterally.  Grip strength is equal bilaterally.  No discoloration of the skin is noted. Neurologic:  Normal speech and language. No gross focal neurologic deficits are appreciated. No gait instability. Skin:  Skin is warm, dry and intact.  Psychiatric: Mood and affect are normal. Speech and behavior are normal.  ____________________________________________   LABS (all labs ordered are listed, but only abnormal results are displayed)  Labs Reviewed - No data to display  RADIOLOGY  ED MD interpretation:   Right shoulder x-ray no acute injury or abnormality noted.  Official radiology report(s): Dg Shoulder Right  Result Date: 04/16/2018 CLINICAL DATA:  Lifting injury yesterday at work (propane tank). Now with pain right lateral shoulder down left outer arm when moving. EXAM: RIGHT SHOULDER - 2+ VIEW COMPARISON:  None. FINDINGS: There is no evidence of fracture or dislocation. There is no evidence of arthropathy or other focal bone abnormality. Soft tissues are unremarkable. IMPRESSION: Negative. Electronically Signed   By: Lajean Manes M.D.   On: 04/16/2018 15:50    ____________________________________________   PROCEDURES  Procedure(s) performed: None  Procedures  Critical Care performed: No  ____________________________________________   INITIAL IMPRESSION / ASSESSMENT AND PLAN / ED COURSE  As part of my medical decision making, I reviewed the following data within the electronic MEDICAL RECORD NUMBER  Notes from prior ED visits and [place] Controlled Substance Database  Patient was placed in a sling.  He was also given a prescription for Norco as needed for pain every 6 hours.  He is to use ice to his shoulder as needed for discomfort.  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  He is aware that he cannot drive or operate machinery while taking the [place].  He is encouraged to call make an appointment with the orthopedist on-call who is Dr. Sabra Heck for evaluation before he can return to work.  ____________________________________________   FINAL CLINICAL IMPRESSION(S) / ED DIAGNOSES  Final diagnoses:  Strain of right shoulder, initial encounter     ED Discharge Orders        Ordered    HYDROcodone-acetaminophen (NORCO/VICODIN) 5-325 MG  tablet  Every 6 hours PRN     04/16/18 1603       Note:  This document was prepared using Dragon voice recognition software and may include unintentional dictation errors.    Johnn Hai, PA-C 04/16/18 1642    Schaevitz, Randall An, MD 04/18/18 0040

## 2018-05-05 ENCOUNTER — Encounter: Payer: Self-pay | Admitting: Internal Medicine

## 2018-06-10 ENCOUNTER — Telehealth: Payer: Self-pay | Admitting: Internal Medicine

## 2018-06-10 NOTE — Telephone Encounter (Signed)
Copied from [place] [id number]. Topic: Quick Communication - See Telephone Encounter >> Jun 10, 2018 10:52 AM Burchel, Abbi R wrote: See Telephone encounter for: 06/10/18.  Cheri from [facility] (Dr. Marica Otter) called to let Webb Silversmith know that this pt has been scheduled for surgery (rt shoulder).  They faxed over surgical; clearance forms but were unsure if he needed an appt with Rollene Fare first. Please advise.  [facility] [phone])

## 2018-06-10 NOTE — Telephone Encounter (Signed)
Spoke to Robbins at [facility] and let them know that pt will need a pre op OV

## 2018-06-14 NOTE — Telephone Encounter (Signed)
Judeen Hammans called to see if pt needed surgical clearance appt. I scheduled appt while on the phone with her. I left vm msg for pt with  appt time/date.

## 2018-06-15 ENCOUNTER — Ambulatory Visit (INDEPENDENT_AMBULATORY_CARE_PROVIDER_SITE_OTHER): Payer: Worker's Compensation | Admitting: Internal Medicine

## 2018-06-15 ENCOUNTER — Encounter: Payer: Self-pay | Admitting: Internal Medicine

## 2018-06-15 VITALS — BP 134/84 | HR 101 | Temp 97.9°F | Wt 234.0 lb

## 2018-06-15 DIAGNOSIS — Z01818 Encounter for other preprocedural examination: Secondary | ICD-10-CM | POA: Diagnosis not present

## 2018-06-15 LAB — POCT GLYCOSYLATED HEMOGLOBIN (HGB A1C): Hemoglobin A1C: 6.2 % — AB (ref 4.0–5.6)

## 2018-06-15 NOTE — Progress Notes (Signed)
Subjective:    Patient ID: Alex Carr, male    DOB: 11/26/1966, 52 y.o.   MRN: [id number]  HPI  Pt presents to the clinic today for a pre operative exam. He is scheduled for a right shoulder arthroscopy with rotator cuff repair on Friday by Dr. Harlow Mares. No noted cardiovascular/pulmonary issues. He does not smoke.  Review of Systems      Past Medical History:  Diagnosis Date  . Chicken pox   . Colon cancer ([place]) 2000  . History of stomach ulcers   . Hx of colonic polyps   . Kidney stones     Current Outpatient Medications  Medication Sig Dispense Refill  . cyclobenzaprine (FLEXERIL) 10 MG tablet   0  . HYDROcodone-acetaminophen (NORCO/VICODIN) 5-325 MG tablet Take 1 tablet by mouth every 6 (six) hours as needed for moderate pain. 20 tablet 0  . hydrocortisone 2.5 % ointment Apply topically 2 (two) times daily. 30 g 0  . Multiple Vitamin (MULTI-VITAMINS) TABS Take 1 tablet by mouth.    . naproxen (NAPROSYN) 375 MG tablet TAKE 1 TABLET BY MOUTH  TWICE A DAY WITH MEALS 180 tablet 0  . omeprazole (PRILOSEC) 40 MG capsule TAKE 1 CAPSULE BY MOUTH  DAILY 90 capsule 0  . sildenafil (VIAGRA) 25 MG tablet Take 1 tablet (25 mg total) by mouth daily as needed for erectile dysfunction. 10 tablet 0   No current facility-administered medications for this visit.     Allergies  Allergen Reactions  . Fluconazole Rash    Family History  Problem Relation Age of Onset  . Cancer Father        Cancerous Polyps  . Heart disease Father   . Hyperlipidemia Father   . Cancer Paternal Grandfather        Lung    Social History   Socioeconomic History  . Marital status: Married    Spouse name: Not on file  . Number of children: Not on file  . Years of education: Not on file  . Highest education level: Not on file  Occupational History  . Not on file  Social Needs  . Financial resource strain: Not on file  . Food insecurity:    Worry: Not on file    Inability: Not on file  .  Transportation needs:    Medical: Not on file    Non-medical: Not on file  Tobacco Use  . Smoking status: Former Research scientist (life sciences)  . Smokeless tobacco: Never Used  . Tobacco comment: Quit in 1999  Substance and Sexual Activity  . Alcohol use: No  . Drug use: No  . Sexual activity: Yes  Lifestyle  . Physical activity:    Days per week: Not on file    Minutes per session: Not on file  . Stress: Not on file  Relationships  . Social connections:    Talks on phone: Not on file    Gets together: Not on file    Attends religious service: Not on file    Active member of club or organization: Not on file    Attends meetings of clubs or organizations: Not on file    Relationship status: Not on file  . Intimate partner violence:    Fear of current or ex partner: Not on file    Emotionally abused: Not on file    Physically abused: Not on file    Forced sexual activity: Not on file  Other Topics Concern  . Not on file  Social  History Narrative  . Not on file     Constitutional: Denies fever, malaise, fatigue, headache or abrupt weight changes.  HEENT: Denies eye pain, eye redness, ear pain, ringing in the ears, wax buildup, runny nose, nasal congestion, bloody nose, or sore throat. Respiratory: Denies difficulty breathing, shortness of breath, cough or sputum production.   Cardiovascular: Denies chest pain, chest tightness, palpitations or swelling in the hands or feet.  Gastrointestinal: Denies abdominal pain, bloating, constipation, diarrhea or blood in the stool.  GU: Denies urgency, frequency, pain with urination, burning sensation, blood in urine, odor or discharge. Musculoskeletal: Pt reports right shoulder pain. Denies difficulty with gait, muscle pain or joint swelling.  Skin: Denies redness, rashes, lesions or ulcercations.  Neurological: Denies dizziness, difficulty with memory, difficulty with speech or problems with balance and coordination.  Psych: Denies anxiety, depression,  SI/HI.  No other specific complaints in a complete review of systems (except as listed in HPI above).  Objective:   Physical Exam  BP 134/84   Pulse (!) 101   Temp 97.9 F (36.6 C) (Oral)   Wt 234 lb (106.1 kg)   SpO2 97%   BMI 33.58 kg/m  Wt Readings from Last 3 Encounters:  06/15/18 234 lb (106.1 kg)  04/16/18 217 lb (98.4 kg)  03/09/18 217 lb (98.4 kg)    General: Appears his stated age, obese in NAD. HEENT: Throat/Mouth: Teeth present, mucosa pink and moist, no exudate, lesions or ulcerations noted.  Cardiovascular: Normal rate and rhythm. S1,S2 noted.  No murmur, rubs or gallops noted. No JVD or BLE edema.  Pulmonary/Chest: Normal effort and positive vesicular breath sounds. No respiratory distress. No wheezes, rales or ronchi noted.  Abdomen: Soft and nontender. Normal bowel sounds.  Musculoskeletal: Decreased internal and external rotation of the right shoulder. Positive drop can on the right. Strength decreased RUE. Neurological: Alert and oriented. Sensation intact to BUE.   BMET    Component Value Date/Time   NA 139 01/11/2017 1520   NA 138 11/21/2014 1758   K 4.3 01/11/2017 1520   K 3.9 11/21/2014 1758   CL 105 01/11/2017 1520   CL 105 11/21/2014 1758   CO2 29 01/11/2017 1520   CO2 25 11/21/2014 1758   GLUCOSE 90 01/11/2017 1520   GLUCOSE 88 11/21/2014 1758   BUN 13 01/11/2017 1520   BUN 14 11/21/2014 1758   CREATININE 0.86 01/11/2017 1520   CREATININE 0.89 11/21/2014 1758   CALCIUM 9.6 01/11/2017 1520   CALCIUM 8.9 11/21/2014 1758   GFRNONAA >60 11/21/2014 1758   GFRAA >60 11/21/2014 1758    Lipid Panel     Component Value Date/Time   CHOL 157 01/11/2017 1520   TRIG 68.0 01/11/2017 1520   HDL 63.50 01/11/2017 1520   CHOLHDL 2 01/11/2017 1520   VLDL 13.6 01/11/2017 1520   LDLCALC 80 01/11/2017 1520    CBC    Component Value Date/Time   WBC 4.9 01/11/2017 1520   RBC 4.49 01/11/2017 1520   HGB 13.5 01/11/2017 1520   HGB 13.9 11/21/2014  1758   HCT 39.5 01/11/2017 1520   HCT 41.9 11/21/2014 1758   PLT 215.0 01/11/2017 1520   PLT 229 11/21/2014 1758   MCV 88.0 01/11/2017 1520   MCV 92 11/21/2014 1758   MCH 30.6 11/21/2014 1758   MCHC 34.2 01/11/2017 1520   RDW 14.2 01/11/2017 1520   RDW 13.1 11/21/2014 1758   LYMPHSABS 1.8 11/21/2014 1758   MONOABS 0.6 11/21/2014 1758  EOSABS 0.1 11/21/2014 1758   BASOSABS 0.0 11/21/2014 1758    Hgb A1C Lab Results  Component Value Date   HGBA1C 5.4 07/25/2015            Assessment & Plan:   Pre-Op Screening Exam:  Indication for ECG: preop Results: normal ECG by my interpretation Will check U/A, CMC, CMET, A1C Surgical clearance form signed, will fax back once lab results are available  Return precautions discussed Webb Silversmith, NP

## 2018-06-15 NOTE — Patient Instructions (Signed)
Preventing Constipation After Surgery Constipation is when a person has fewer than 3 bowel movements a week; has difficulty having a bowel movement; or has stools that are dry, hard, or larger than normal. Many things can make constipation likely after surgery. They include:  Medicines, especially numbing medicines (anesthetics) and very strong pain medicines called narcotics.  Feeling stressed because of the surgery.  Eating different foods than normal.  Being less active.  Symptoms of constipation include:  Having fewer than 3 bowel movements a week.  Straining to have a bowel movement.  Having hard, dry, or larger-than-normal stools.  Feeling full or bloated.  Having pain in the lower abdomen.  Not feeling relief after having a bowel movement.  Follow these instructions at home: Diet  Eat foods that have a lot of fiber. These include fruits, vegetables, whole grains, and beans. Limit foods high in fat and processed sugars. These include french fries, hamburgers, cookies, and candy.  Take a fiber supplement as directed. If you are not taking a fiber supplement and think that you are not getting enough fiber from foods, talk to your health care provider about adding a fiber supplement to your diet.  Drink clear fluids, especially water. Avoid drinking alcohol, caffeine, and soda. These can make constipation worse.  Drink enough fluids to keep your urine clear or pale yellow. Activity  After surgery, return to your normal activities slowly or when your health care provider says it is okay.  Start walking as soon as you can. Try to go a little farther each day.  Once your health care provider approves, do some sort of regular exercise. This helps prevent constipation. Bowel Movements  Go to the restroom when you have the urge to go. Do not hold it in.  Try drinking something hot to get a bowel movement started.  Keep track of how often you use the restroom. If you miss  2-3 bowel movements, talk to your health care provider about medicines that prevent constipation. Your health care provider may suggest a stool softener, laxative, or fiber supplement.  Only take over-the-counter or prescription medicines as directed by your health care provider.  Do not take other medicines without talking to your health care provider first. If you become constipated and take a medicine to make you have a bowel movement, the problem may get worse. Other kinds of medicine can also make the problem worse. Contact a health care provider if:  You used stool softeners or laxatives and still have not had a bowel movement within 24-48 hours after using them.  You have not had a bowel movement in 3 days. Get help right away if:  Your constipation lasts for more than 4 days or gets worse.  You have bright red blood in your stool.  You have abdominal or rectal pain.  You have very bad cramping.  You have thin, pencil-like stools.  You have unexplained weight loss.  You have a fever or persistent symptoms for more than 2-3 days.  You have a fever and your symptoms suddenly get worse. This information is not intended to replace advice given to you by your health care provider. Make sure you discuss any questions you have with your health care provider. Document Released: 04/03/2013 Document Revised: 05/14/2016 Document Reviewed: 01/20/2013 Elsevier Interactive Patient Education  [employer].

## 2018-06-16 ENCOUNTER — Encounter: Payer: Self-pay | Admitting: *Deleted

## 2018-06-16 ENCOUNTER — Telehealth: Payer: Self-pay

## 2018-06-16 LAB — POC URINALSYSI DIPSTICK (AUTOMATED)
BILIRUBIN UA: NEGATIVE
Blood, UA: NEGATIVE
GLUCOSE UA: NEGATIVE
KETONES UA: NEGATIVE
LEUKOCYTES UA: NEGATIVE
Nitrite, UA: NEGATIVE
PROTEIN UA: NEGATIVE
SPEC GRAV UA: 1.015 (ref 1.010–1.025)
Urobilinogen, UA: 0.2 E.U./dL
pH, UA: 6 (ref 5.0–8.0)

## 2018-06-16 LAB — COMPREHENSIVE METABOLIC PANEL
ALT: 36 U/L (ref 0–53)
AST: 25 U/L (ref 0–37)
Albumin: 4.3 g/dL (ref 3.5–5.2)
Alkaline Phosphatase: 143 U/L — ABNORMAL HIGH (ref 39–117)
BUN: 16 mg/dL (ref 6–23)
CALCIUM: 9.8 mg/dL (ref 8.4–10.5)
CHLORIDE: 102 meq/L (ref 96–112)
CO2: 30 mEq/L (ref 19–32)
Creatinine, Ser: 0.94 mg/dL (ref 0.40–1.50)
GFR: 89.28 mL/min (ref >60.00)
Glucose, Bld: 203 mg/dL — ABNORMAL HIGH (ref 70–99)
Potassium: 4.5 mEq/L (ref 3.5–5.1)
Sodium: 138 mEq/L (ref 135–145)
Total Bilirubin: 0.4 mg/dL (ref 0.2–1.2)
Total Protein: 7 g/dL (ref 6.0–8.3)

## 2018-06-16 LAB — CBC
HEMATOCRIT: 38.9 % — AB (ref 39.0–52.0)
HEMOGLOBIN: 13 g/dL (ref 13.0–17.0)
MCHC: 33.5 g/dL (ref 30.0–36.0)
MCV: 85.8 fl (ref 78.0–100.0)
PLATELETS: 217 10*3/uL (ref 150.0–400.0)
RBC: 4.54 Mil/uL (ref 4.22–5.81)
RDW: 15.7 % — ABNORMAL HIGH (ref 11.5–15.5)
WBC: 4.8 10*3/uL (ref 4.0–10.5)

## 2018-06-16 NOTE — Telephone Encounter (Signed)
Labs are still pending. Will fax this as soon as completed.

## 2018-06-16 NOTE — Telephone Encounter (Signed)
[facility] calling, they did not receive lad work. Fax# [phone]

## 2018-06-16 NOTE — Telephone Encounter (Signed)
Shalanda at [facility] called; surgeon office on phone wanting to know if surgical clearance was done yet; pt scheduled for surgery on 06/17/18. I advised per office note yesterday that surgical clearance form was done and signed but will fax after Avie Echevaria NP reviews labs drawn on 06/15/18. Advised shalanda of this info and also that all labs were not back yet. FYI to Avie Echevaria NP.

## 2018-06-16 NOTE — Telephone Encounter (Signed)
I have faxed with A1c, UA and EKG for now... I have faxed 3 times to ensure receipt

## 2018-06-16 NOTE — Telephone Encounter (Signed)
I spoke to Alex Carr and she states they do have ppw

## 2018-06-19 ENCOUNTER — Other Ambulatory Visit: Payer: Self-pay | Admitting: Internal Medicine

## 2018-07-06 ENCOUNTER — Other Ambulatory Visit: Payer: Self-pay | Admitting: Internal Medicine

## 2018-07-06 DIAGNOSIS — K219 Gastro-esophageal reflux disease without esophagitis: Secondary | ICD-10-CM

## 2018-09-25 ENCOUNTER — Other Ambulatory Visit: Payer: Self-pay | Admitting: Internal Medicine

## 2018-09-25 DIAGNOSIS — K219 Gastro-esophageal reflux disease without esophagitis: Secondary | ICD-10-CM

## 2018-10-26 ENCOUNTER — Other Ambulatory Visit: Payer: Self-pay | Admitting: Internal Medicine

## 2018-10-26 MED ORDER — NAPROXEN 375 MG PO TABS: 375.0000 mg | ORAL_TABLET | Freq: Two times a day (BID) | ORAL | 0 refills | Status: DC

## 2018-10-26 MED ORDER — SILDENAFIL CITRATE 25 MG PO TABS: 25.0000 mg | ORAL_TABLET | Freq: Every day | ORAL | 2 refills | Status: DC | PRN

## 2018-11-28 ENCOUNTER — Ambulatory Visit: Payer: Self-pay | Admitting: Orthopedic Surgery

## 2018-11-29 ENCOUNTER — Encounter
Admission: RE | Admit: 2018-11-29 | Discharge: 2018-11-29 | Disposition: A | Discharge status: Home / Self Care | Payer: Self-pay | Source: Ambulatory Visit | Attending: Orthopedic Surgery | Admitting: Orthopedic Surgery

## 2018-11-29 ENCOUNTER — Other Ambulatory Visit: Payer: Self-pay

## 2018-11-29 HISTORY — DX: Unspecified osteoarthritis, unspecified site: M19.90

## 2018-11-29 HISTORY — DX: Personal history of urinary calculi: Z87.442

## 2018-11-29 MED ORDER — LACTATED RINGERS IV SOLN
INTRAVENOUS | Status: DC
  Administered 2018-11-30: 08:00:00 via INTRAVENOUS

## 2018-11-29 MED ORDER — LACTATED RINGERS IV SOLN: INTRAVENOUS | Status: DC

## 2018-11-29 MED ORDER — GABAPENTIN 300 MG PO CAPS
300.0000 mg | ORAL_CAPSULE | Freq: Once | ORAL | Status: AC
  Administered 2018-11-30: 300 mg via ORAL

## 2018-11-29 MED ORDER — CEFAZOLIN SODIUM-DEXTROSE 2-4 GM/100ML-% IV SOLN
2.0000 g | INTRAVENOUS | Status: AC
  Administered 2018-11-30: 2 g via INTRAVENOUS

## 2018-11-29 MED ORDER — ACETAMINOPHEN 500 MG PO TABS
1000.0000 mg | ORAL_TABLET | Freq: Once | ORAL | Status: AC
  Administered 2018-11-30: 1000 mg via ORAL

## 2018-11-29 MED ORDER — CHLORHEXIDINE GLUCONATE 4 % EX LIQD: 60.0000 mL | Freq: Once | CUTANEOUS | Status: DC

## 2018-11-29 NOTE — Patient Instructions (Signed)
Your procedure is scheduled on:11/30/18 Report to Day Surgery. [facility]OND FLOOR To find out your arrival time please call [phone] between 1PM - 3PM on 11/29/18.  Remember: Instructions that are not followed completely may result in serious medical risk,  up to and including death, or upon the discretion of your surgeon and anesthesiologist your  surgery may need to be rescheduled.     _X__ 1. Do not eat food after midnight the night before your procedure.                 No gum chewing or hard candies. You may drink clear liquids up to 2 hours                 before you are scheduled to arrive for your surgery- DO not drink clear                 liquids within 2 hours of the start of your surgery.                 Clear Liquids include:  water, apple juice without pulp, clear carbohydrate                 drink such as Clearfast of Gatorade, Black Coffee or Tea (Do not add                 anything to coffee or tea).  __X__2.  On the morning of surgery brush your teeth with toothpaste and water, you                may rinse your mouth with mouthwash if you wish.  Do not swallow any toothpaste of mouthwash.     _X__ 3.  No Alcohol for 24 hours before or after surgery.   _X__ 4.  Do Not Smoke or use e-cigarettes For 24 Hours Prior to Your Surgery.                 Do not use any chewable tobacco products for at least 6 hours prior to                 surgery.  ____  5.  Bring all medications with you on the day of surgery if instructed.   _X   6.  Notify your doctor if there is any change in your medical condition      (cold, fever, infections).     Do not wear jewelry, make-up, hairpins, clips or nail polish. Do not wear lotions, powders, or perfumes. You may wear deodorant. Do not shave 48 hours prior to surgery. Men may shave face and neck. Do not bring valuables to the hospital.    [facility] is not responsible for any belongings or  valuables.  Contacts, dentures or bridgework may not be worn into surgery. Leave your suitcase in the car. After surgery it may be brought to your room. For patients admitted to the hospital, discharge time is determined by your treatment team.   Patients discharged the day of surgery will not be allowed to drive home.      __X__ Take these medicines the morning of surgery with A SIP OF WATER:    1.OMEPRAZOLE AT BEDTIME 11/29/18 AND AM OF SURGERY  2.   3.   4.  5.  6.  ____ Fleet Enema (as directed)   ____ Use CHG Soap as directed  ____ Use inhalers on the day of surgery  ____  Stop metformin 2 days prior to surgery    ____ Take 1/2 of usual insulin dose the night before surgery. No insulin the morning          of surgery.   ____ Stop Coumadin/Plavix/aspirin on   ____ Stop Anti-inflammatories on    ____ Stop supplements until after surgery.    ____ Bring C-Pap to the hospital.

## 2018-11-30 ENCOUNTER — Ambulatory Visit: Payer: Worker's Compensation | Admitting: Anesthesiology

## 2018-11-30 ENCOUNTER — Ambulatory Visit
Admission: RE | Admit: 2018-11-30 | Discharge: 2018-11-30 | Disposition: A | Discharge status: Home / Self Care | Payer: Worker's Compensation | Attending: Orthopedic Surgery | Admitting: Orthopedic Surgery

## 2018-11-30 ENCOUNTER — Encounter
Admission: RE | Disposition: A | Discharge status: Home / Self Care | Payer: Self-pay | Source: Home / Self Care | Attending: Orthopedic Surgery

## 2018-11-30 ENCOUNTER — Encounter: Payer: Self-pay | Admitting: Emergency Medicine

## 2018-11-30 DIAGNOSIS — M75101 Unspecified rotator cuff tear or rupture of right shoulder, not specified as traumatic: Secondary | ICD-10-CM | POA: Insufficient documentation

## 2018-11-30 DIAGNOSIS — K219 Gastro-esophageal reflux disease without esophagitis: Secondary | ICD-10-CM | POA: Diagnosis not present

## 2018-11-30 DIAGNOSIS — Z87891 Personal history of nicotine dependence: Secondary | ICD-10-CM | POA: Insufficient documentation

## 2018-11-30 HISTORY — PX: SHOULDER ARTHROSCOPY WITH OPEN ROTATOR CUFF REPAIR: SHX6092

## 2018-11-30 SURGERY — ARTHROSCOPY, SHOULDER WITH REPAIR, ROTATOR CUFF, OPEN
Anesthesia: General | Laterality: Right

## 2018-11-30 MED ORDER — ONDANSETRON HCL 4 MG/2ML IJ SOLN: 4.0000 mg | Freq: Four times a day (QID) | INTRAMUSCULAR | Status: DC | PRN

## 2018-11-30 MED ORDER — METOCLOPRAMIDE HCL 5 MG/ML IJ SOLN: 5.0000 mg | Freq: Three times a day (TID) | INTRAMUSCULAR | Status: DC | PRN

## 2018-11-30 MED ORDER — LIDOCAINE HCL (PF) 1 % IJ SOLN
INTRAMUSCULAR | Status: AC
  Filled 2018-11-30: qty 5

## 2018-11-30 MED ORDER — HYDROCODONE-ACETAMINOPHEN 5-325 MG PO TABS: 1.0000 | ORAL_TABLET | ORAL | Status: DC | PRN

## 2018-11-30 MED ORDER — HYDROCODONE-ACETAMINOPHEN 7.5-325 MG PO TABS: 1.0000 | ORAL_TABLET | ORAL | Status: DC | PRN

## 2018-11-30 MED ORDER — ONDANSETRON HCL 4 MG PO TABS: 4.0000 mg | ORAL_TABLET | Freq: Four times a day (QID) | ORAL | Status: DC | PRN

## 2018-11-30 MED ORDER — CEFAZOLIN SODIUM-DEXTROSE 2-4 GM/100ML-% IV SOLN
INTRAVENOUS | Status: AC
  Filled 2018-11-30: qty 100

## 2018-11-30 MED ORDER — HYDROCODONE-ACETAMINOPHEN 5-325 MG PO TABS: 1.0000 | ORAL_TABLET | Freq: Four times a day (QID) | ORAL | 0 refills | Status: AC | PRN

## 2018-11-30 MED ORDER — DOCUSATE SODIUM 100 MG PO CAPS: 100.0000 mg | ORAL_CAPSULE | Freq: Every day | ORAL | 2 refills | Status: AC | PRN

## 2018-11-30 MED ORDER — LIDOCAINE HCL (PF) 2 % IJ SOLN
INTRAMUSCULAR | Status: AC
  Filled 2018-11-30: qty 10

## 2018-11-30 MED ORDER — PHENYLEPHRINE HCL 10 MG/ML IJ SOLN
INTRAMUSCULAR | Status: DC | PRN
  Administered 2018-11-30 (×10): 100 ug via INTRAVENOUS
  Administered 2018-11-30: 200 ug via INTRAVENOUS
  Administered 2018-11-30 (×2): 100 ug via INTRAVENOUS

## 2018-11-30 MED ORDER — SODIUM CHLORIDE (PF) 0.9 % IJ SOLN
INTRAMUSCULAR | Status: AC
  Filled 2018-11-30: qty 50

## 2018-11-30 MED ORDER — FENTANYL CITRATE (PF) 100 MCG/2ML IJ SOLN
INTRAMUSCULAR | Status: DC | PRN
  Administered 2018-11-30 (×2): 50 ug via INTRAVENOUS

## 2018-11-30 MED ORDER — BUPIVACAINE-EPINEPHRINE (PF) 0.25% -1:200000 IJ SOLN
INTRAMUSCULAR | Status: AC
  Filled 2018-11-30: qty 30

## 2018-11-30 MED ORDER — FENTANYL CITRATE (PF) 100 MCG/2ML IJ SOLN
50.0000 ug | Freq: Once | INTRAMUSCULAR | Status: AC
  Administered 2018-11-30: 50 ug via INTRAVENOUS

## 2018-11-30 MED ORDER — PHENOL 1.4 % MT LIQD: 1.0000 | OROMUCOSAL | Status: DC | PRN

## 2018-11-30 MED ORDER — BUPIVACAINE LIPOSOME 1.3 % IJ SUSP
INTRAMUSCULAR | Status: DC | PRN
  Administered 2018-11-30: 20 mL via PERINEURAL

## 2018-11-30 MED ORDER — EPHEDRINE SULFATE 50 MG/ML IJ SOLN
INTRAMUSCULAR | Status: AC
  Filled 2018-11-30: qty 1

## 2018-11-30 MED ORDER — FENTANYL CITRATE (PF) 100 MCG/2ML IJ SOLN
INTRAMUSCULAR | Status: AC
  Filled 2018-11-30: qty 2

## 2018-11-30 MED ORDER — ONDANSETRON HCL 4 MG/2ML IJ SOLN: 4.0000 mg | Freq: Once | INTRAMUSCULAR | Status: DC | PRN

## 2018-11-30 MED ORDER — GABAPENTIN 300 MG PO CAPS
ORAL_CAPSULE | ORAL | Status: AC
  Administered 2018-11-30: 300 mg via ORAL
  Filled 2018-11-30: qty 1

## 2018-11-30 MED ORDER — PHENYLEPHRINE HCL 10 MG/ML IJ SOLN
INTRAMUSCULAR | Status: AC
  Filled 2018-11-30: qty 1

## 2018-11-30 MED ORDER — ROCURONIUM BROMIDE 100 MG/10ML IV SOLN
INTRAVENOUS | Status: DC | PRN
  Administered 2018-11-30: 50 mg via INTRAVENOUS
  Administered 2018-11-30: 10 mg via INTRAVENOUS

## 2018-11-30 MED ORDER — MORPHINE SULFATE (PF) 4 MG/ML IV SOLN: 0.5000 mg | INTRAVENOUS | Status: DC | PRN

## 2018-11-30 MED ORDER — ACETAMINOPHEN 500 MG PO TABS
ORAL_TABLET | ORAL | Status: AC
  Administered 2018-11-30: 1000 mg via ORAL
  Filled 2018-11-30: qty 2

## 2018-11-30 MED ORDER — FENTANYL CITRATE (PF) 100 MCG/2ML IJ SOLN
INTRAMUSCULAR | Status: AC
  Administered 2018-11-30: 50 ug via INTRAVENOUS
  Filled 2018-11-30: qty 2

## 2018-11-30 MED ORDER — DOCUSATE SODIUM 100 MG PO CAPS: 100.0000 mg | ORAL_CAPSULE | Freq: Two times a day (BID) | ORAL | Status: DC

## 2018-11-30 MED ORDER — EPINEPHRINE 30 MG/30ML IJ SOLN
INTRAMUSCULAR | Status: AC
  Filled 2018-11-30: qty 1

## 2018-11-30 MED ORDER — PROPOFOL 10 MG/ML IV BOLUS
INTRAVENOUS | Status: AC
  Filled 2018-11-30: qty 20

## 2018-11-30 MED ORDER — LIDOCAINE HCL (CARDIAC) PF 100 MG/5ML IV SOSY
PREFILLED_SYRINGE | INTRAVENOUS | Status: DC | PRN
  Administered 2018-11-30: 100 mg via INTRAVENOUS

## 2018-11-30 MED ORDER — ROCURONIUM BROMIDE 50 MG/5ML IV SOLN
INTRAVENOUS | Status: AC
  Filled 2018-11-30: qty 2

## 2018-11-30 MED ORDER — KETOROLAC TROMETHAMINE 15 MG/ML IJ SOLN: 15.0000 mg | Freq: Four times a day (QID) | INTRAMUSCULAR | Status: DC

## 2018-11-30 MED ORDER — ONDANSETRON HCL 4 MG/2ML IJ SOLN
INTRAMUSCULAR | Status: DC | PRN
  Administered 2018-11-30: 4 mg via INTRAVENOUS

## 2018-11-30 MED ORDER — BUPIVACAINE HCL (PF) 0.5 % IJ SOLN
INTRAMUSCULAR | Status: DC | PRN
  Administered 2018-11-30: 10 mL via PERINEURAL

## 2018-11-30 MED ORDER — ACETAMINOPHEN 325 MG PO TABS: 325.0000 mg | ORAL_TABLET | Freq: Four times a day (QID) | ORAL | Status: DC | PRN

## 2018-11-30 MED ORDER — MENTHOL 3 MG MT LOZG: 1.0000 | LOZENGE | OROMUCOSAL | Status: DC | PRN

## 2018-11-30 MED ORDER — METOCLOPRAMIDE HCL 10 MG PO TABS: 5.0000 mg | ORAL_TABLET | Freq: Three times a day (TID) | ORAL | Status: DC | PRN

## 2018-11-30 MED ORDER — LACTATED RINGERS IV SOLN: INTRAVENOUS | Status: DC

## 2018-11-30 MED ORDER — LIDOCAINE HCL (PF) 2 % IJ SOLN
INTRAMUSCULAR | Status: DC | PRN
  Administered 2018-11-30: .8 mL

## 2018-11-30 MED ORDER — MIDAZOLAM HCL 2 MG/2ML IJ SOLN
INTRAMUSCULAR | Status: AC
  Administered 2018-11-30: 2 mg via INTRAVENOUS
  Filled 2018-11-30: qty 2

## 2018-11-30 MED ORDER — EPINEPHRINE PF 1 MG/ML IJ SOLN
INTRAMUSCULAR | Status: AC
  Filled 2018-11-30: qty 1

## 2018-11-30 MED ORDER — SUGAMMADEX SODIUM 200 MG/2ML IV SOLN
INTRAVENOUS | Status: DC | PRN
  Administered 2018-11-30: 200 mg via INTRAVENOUS

## 2018-11-30 MED ORDER — FENTANYL CITRATE (PF) 100 MCG/2ML IJ SOLN: 25.0000 ug | INTRAMUSCULAR | Status: DC | PRN

## 2018-11-30 MED ORDER — BUPIVACAINE HCL (PF) 0.5 % IJ SOLN
INTRAMUSCULAR | Status: AC
  Filled 2018-11-30: qty 10

## 2018-11-30 MED ORDER — MIDAZOLAM HCL 2 MG/2ML IJ SOLN
INTRAMUSCULAR | Status: AC
  Filled 2018-11-30: qty 2

## 2018-11-30 MED ORDER — BUPIVACAINE LIPOSOME 1.3 % IJ SUSP
INTRAMUSCULAR | Status: AC
  Filled 2018-11-30: qty 20

## 2018-11-30 MED ORDER — MIDAZOLAM HCL 2 MG/2ML IJ SOLN
2.0000 mg | Freq: Once | INTRAMUSCULAR | Status: AC
  Administered 2018-11-30: 2 mg via INTRAVENOUS

## 2018-11-30 MED ORDER — PROPOFOL 10 MG/ML IV BOLUS
INTRAVENOUS | Status: DC | PRN
  Administered 2018-11-30: 170 mg via INTRAVENOUS

## 2018-11-30 SURGICAL SUPPLY — 68 items
ADAPTER IRRIG TUBE 2 SPIKE SOL (ADAPTER) ×4 IMPLANT
ANCHOR SUT CROSSFT KNTLS 4.75 (Anchor) ×2 IMPLANT
ANCHOR YKNOT PRO RC BLUE TAPE (Anchor) ×1 IMPLANT
ANCHOR YKNOT PRO RC HI-FI TAPE (Anchor) ×1 IMPLANT
BLADE FULL RADIUS 3.5 (BLADE) ×2 IMPLANT
BLADE INCISOR PLUS 4.5 (BLADE) ×2 IMPLANT
BLADE SURG MINI STRL (BLADE) ×2 IMPLANT
BRUSH SCRUB EZ  4% CHG (MISCELLANEOUS) ×1
BRUSH SCRUB EZ 4% CHG (MISCELLANEOUS) ×1 IMPLANT
BUR BR 5.5 WIDE MOUTH (BURR) IMPLANT
CANNULA 5.75X7 CRYSTAL CLEAR (CANNULA) ×2 IMPLANT
CANNULA PARTIAL THREAD 2X7 (CANNULA) IMPLANT
CANNULA TWIST IN 8.25X9CM (CANNULA) ×2 IMPLANT
CHLORAPREP W/TINT 26ML (MISCELLANEOUS) ×2 IMPLANT
COOLER POLAR GLACIER W/PUMP (MISCELLANEOUS) ×2 IMPLANT
COVER WAND RF STERILE (DRAPES) ×2 IMPLANT
CRADLE LAMINECT ARM (MISCELLANEOUS) ×2 IMPLANT
DEVICE SUCT BLK HOLE OR FLOOR (MISCELLANEOUS) ×1 IMPLANT
DRAPE IMP U-DRAPE 54X76 (DRAPES) ×4 IMPLANT
DRAPE INCISE IOBAN 66X45 STRL (DRAPES) ×2 IMPLANT
DRAPE SHEET LG 3/4 BI-LAMINATE (DRAPES) ×2 IMPLANT
DRAPE STERI 35X30 U-POUCH (DRAPES) ×2 IMPLANT
DRAPE U-SHAPE 47X51 STRL (DRAPES) ×3 IMPLANT
ELECT REM PT RETURN 9FT ADLT (ELECTROSURGICAL) ×2
ELECTRODE REM PT RTRN 9FT ADLT (ELECTROSURGICAL) ×1 IMPLANT
GAUZE 4X4 16PLY RFD (DISPOSABLE) IMPLANT
GAUZE PETRO XEROFOAM 1X8 (MISCELLANEOUS) ×2 IMPLANT
GAUZE SPONGE 4X4 12PLY STRL (GAUZE/BANDAGES/DRESSINGS) ×2 IMPLANT
GLOVE BIOGEL PI IND STRL 8 (GLOVE) ×1 IMPLANT
GLOVE BIOGEL PI INDICATOR 8 (GLOVE) ×1
GLOVE SURG ORTHO 8.0 STRL STRW (GLOVE) ×4 IMPLANT
GOWN STRL REUS W/ TWL LRG LVL3 (GOWN DISPOSABLE) ×1 IMPLANT
GOWN STRL REUS W/ TWL XL LVL3 (GOWN DISPOSABLE) ×1 IMPLANT
GOWN STRL REUS W/TWL LRG LVL3 (GOWN DISPOSABLE) ×1
GOWN STRL REUS W/TWL XL LVL3 (GOWN DISPOSABLE) ×1
IV LACTATED RINGER IRRG 3000ML (IV SOLUTION) ×4
IV LR IRRIG 3000ML ARTHROMATIC (IV SOLUTION) ×6 IMPLANT
KIT STABILIZATION SHOULDER (MISCELLANEOUS) ×2 IMPLANT
KIT TURNOVER KIT A (KITS) ×2 IMPLANT
MANIFOLD NEPTUNE II (INSTRUMENTS) ×4 IMPLANT
MASK FACE SPIDER DISP (MASK) ×2 IMPLANT
MAT ABSORB  FLUID 56X50 GRAY (MISCELLANEOUS) ×1
MAT ABSORB FLUID 56X50 GRAY (MISCELLANEOUS) ×1 IMPLANT
NDL SAFETY ECLIPSE 18X1.5 (NEEDLE) ×1 IMPLANT
NDL SCORPION MULTI FIRE (NEEDLE) IMPLANT
NDL SPNL 18GX3.5 QUINCKE PK (NEEDLE) ×1 IMPLANT
NEEDLE HYPO 18GX1.5 SHARP (NEEDLE) ×1
NEEDLE HYPO 22GX1.5 SAFETY (NEEDLE) ×2 IMPLANT
NEEDLE SCORPION MULTI FIRE (NEEDLE) ×2 IMPLANT
NEEDLE SPNL 18GX3.5 QUINCKE PK (NEEDLE) ×2 IMPLANT
PACK ARTHROSCOPY SHOULDER (MISCELLANEOUS) ×2 IMPLANT
PAD ABD DERMACEA PRESS 5X9 (GAUZE/BANDAGES/DRESSINGS) IMPLANT
PAD WRAPON POLAR SHDR XLG (MISCELLANEOUS) ×1 IMPLANT
SLING ARM LRG DEEP (SOFTGOODS) IMPLANT
STRAP SAFETY 5IN WIDE (MISCELLANEOUS) ×2 IMPLANT
STRIP CLOSURE SKIN 1/4X4 (GAUZE/BANDAGES/DRESSINGS) IMPLANT
SUT ETHILON NAB PS2 4-0 18IN (SUTURE) ×2 IMPLANT
SUT FIBERWIRE #2 38 T-5 BLUE (SUTURE)
SUT PDS AB 0 CT1 27 (SUTURE) ×2 IMPLANT
SUT TIGER TAPE 7 IN WHITE (SUTURE) IMPLANT
SUTURE FIBERWR #2 38 T-5 BLUE (SUTURE) IMPLANT
SYR 10ML LL (SYRINGE) ×2 IMPLANT
SYR 50ML LL SCALE MARK (SYRINGE) ×2 IMPLANT
TAPE MICROFOAM 4IN (TAPE) ×2 IMPLANT
TUBING ARTHRO INFLOW-ONLY STRL (TUBING) ×2 IMPLANT
TUBING CONNECTING 10 (TUBING) ×2 IMPLANT
WAND HAND CNTRL MULTIVAC 90 (MISCELLANEOUS) ×2 IMPLANT
WRAPON POLAR PAD SHDR XLG (MISCELLANEOUS) ×2

## 2018-11-30 NOTE — Anesthesia Preprocedure Evaluation (Addendum)
Anesthesia Evaluation  Patient identified by MRN, date of birth, ID band Patient awake    Reviewed: Allergy & Precautions, NPO status , Patient's Chart, lab work & pertinent test results  History of Anesthesia Complications Negative for: history of anesthetic complications  Airway Mallampati: II       Dental   Pulmonary neg sleep apnea, neg COPD, former smoker,           Cardiovascular (-) hypertension(-) Past MI and (-) CHF (-) dysrhythmias (-) Valvular Problems/Murmurs     Neuro/Psych neg Seizures    GI/Hepatic Neg liver ROS, GERD  Medicated and Controlled,  Endo/Other  neg diabetes  Renal/GU negative Renal ROS     Musculoskeletal   Abdominal   Peds  Hematology   Anesthesia Other Findings   Reproductive/Obstetrics                            Anesthesia Physical Anesthesia Plan  ASA: II  Anesthesia Plan: General   Post-op Pain Management: GA combined w/ Regional for post-op pain   Induction: Intravenous  PONV Risk Score and Plan: 2 and Dexamethasone and Ondansetron  Airway Management Planned: Oral ETT  Additional Equipment:   Intra-op Plan:   Post-operative Plan:   Informed Consent: I have reviewed the patients History and Physical, chart, labs and discussed the procedure including the risks, benefits and alternatives for the proposed anesthesia with the patient or authorized representative who has indicated his/her understanding and acceptance.     Plan Discussed with:   Anesthesia Plan Comments:         Anesthesia Quick Evaluation

## 2018-11-30 NOTE — Anesthesia Postprocedure Evaluation (Signed)
Anesthesia Post Note  Patient: Alex Carr  Procedure(s) Performed: SHOULDER ARTHROSCOPY WITH ROTATOR CUFF REPAIR (Right )  Patient location during evaluation: PACU Anesthesia Type: General Level of consciousness: awake and alert Pain management: pain level controlled Vital Signs Assessment: post-procedure vital signs reviewed and stable Respiratory status: spontaneous breathing and respiratory function stable Cardiovascular status: stable Anesthetic complications: no     Last Vitals:  Vitals:   11/30/18 0853 11/30/18 1120  BP: 123/75 111/68  Pulse: 82 89  Resp: 12 (!) 22  Temp:  (!) 36.2 C  SpO2: 97% 100%    Last Pain:  Vitals:   11/30/18 1120  TempSrc:   PainSc: 0-No pain                 KEPHART,WILLIAM K

## 2018-11-30 NOTE — Discharge Instructions (Signed)
Interscalene Nerve Block, Care After This sheet gives you information about how to care for yourself after your procedure. Your health care provider may also give you more specific instructions. If you have problems or questions, contact your health care provider. What can I expect after the procedure? After the procedure, it is common to have:  Soreness or tenderness in your neck.  Numbness in your shoulder, upper arm, and some fingers.  Weakness in your shoulder and arm muscles.  The feeling and strength in your shoulder, arm, and fingers should return to normal within hours after your procedure. Follow these instructions at home: For at least 24 hours after the procedure:  Do not: ? Participate in activities in which you could fall or become injured. ? Drive. ? Use heavy machinery. ? Drink alcohol. ? Take sleeping pills or medicines that cause drowsiness. ? Make important decisions or sign legal documents. ? Take care of children on your own.  Rest. Eating and drinking  If you vomit, drink water, juice, or soup when you can drink without vomiting.  Make sure you have little or no nausea before eating solid foods.  Follow the diet that is recommended by your health care provider. If you have a sling:  Wear it as told by your health care provider. Remove it only as told by your health care provider.  Loosen the sling if your fingers tingle, become numb, or turn cold and blue.  Make sure that your entire arm, including your wrist, is supported. Do not allow your wrist to dangle over the end of the sling.  Do not let your sling get wet if it is not waterproof.  Keep the sling clean. Bathing  Do not take baths, swim, or use a hot tub until your health care provider approves.  If you have a nerve block catheter in place, keep the incision site and tubing dry. Injection site care  Wash your hands with soap and water before you change your bandage (dressing). If soap and  water are not available, use hand sanitizer.  Change your dressing as told by your health care provider.  Keep your dressing dry.  Check your nerve block injection site every day for signs of infection. Check for: ? Redness, swelling, or pain. ? Fluid or blood. ? Warmth. Activity  Do not perform complex or risky activities while taking prescription pain medicine and until you have fully recovered.  Return to your normal activities as told by your health care provider and as you can tolerate them. Ask your health care provider what activities are safe for you.  Rest and take it easy. This will help you heal and recover more quickly and fully.  Be very cautious until you have regained strength and sensation. General instructions  Have a responsible adult stay with you until you are awake and alert.  Do not drive or use heavy machinery while taking prescription pain medicine and until you have fully recovered. Ask your health care provider when it is safe to drive.  Take over-the-counter and prescription medicines only as told by your health care provider.  If you smoke, do not smoke without supervision.  Do not expose your arm or shoulder to very cold or very hot temperatures until you have full feeling back.  If you have a nerve block catheter in place: ? Try to keep the catheter from getting kinked or pinched. ? Avoid pulling or tugging on the catheter.  Keep all follow-up visits as told  by your health care provider. This is important. Contact a health care provider if:  You have chills or fever.  You have redness, swelling, or pain around your injection site.  You have fluid or blood coming from the injection site.  The skin around the injection site is warm to the touch.  There is a bad smell coming from your dressing.  You have hoarseness or a drooping or dry eye that lasts more than a few days.  You have pain that is poorly controlled with the block or with pain  medicine.  You have numbness, tingling, or weakness in your shoulder or arm that lasts for more than one week. Get help right away if:  You have severe pain.  You lose or do not regain strength and sensation in your arm even after the nerve block medicine has stopped.  You have trouble breathing.  You have a nerve block catheter still in place and you begin to shiver.  You have a nerve block catheter still in place and you are getting more and more numb or weak. This information is not intended to replace advice given to you by your health care provider. Make sure you discuss any questions you have with your health care provider. Document Released: 11/29/2015 Document Revised: 08/07/2016 Document Reviewed: 08/07/2016 Elsevier Interactive Patient Education  2018 [place]   1) The drugs that you were given will stay in your system until tomorrow so for the next 24 hours you should not:  A) Drive an automobile B) Make any legal decisions C) Drink any alcoholic beverage   2) You may resume regular meals tomorrow.  Today it is better to start with liquids and gradually work up to solid foods.  You may eat anything you prefer, but it is better to start with liquids, then soup and crackers, and gradually work up to solid foods.   3) Please notify your doctor immediately if you have any unusual bleeding, trouble breathing, redness and pain at the surgery site, drainage, fever, or pain not relieved by medication.    4) Additional Instructions:        Please contact your physician with any problems or Same Day Surgery at [phone], Monday through Friday 6 am to 4 pm, or [place] [facility] number at [phone].    Wear sling at all times, including sleep.  You will need to use the sling for a total of 4 weeks following surgery.  Do not try and lift your arm up or away from your body for any reason.   Keep  the dressing dry.  You may remove bandage in 3 days.  You may place Band-Aids over top of the incisions.  May shower once dressing is removed in 3 days.  Remove sling carefully only for showers, leaving arm down by your side while in the shower.  +++ Make sure to take some pain medication this evening before you fall asleep, in preparation for the nerve block wearing off in the middle of the night.  If the the pain medication causes itching, or is too strong, try taking a single tablet at a time, or combining with Benadryl.  You may be most comfortable sleeping in a recliner.  If you do sleep in near bed, placed pillows behind the shoulder that have the operation to support it.

## 2018-11-30 NOTE — Transfer of Care (Signed)
Immediate Anesthesia Transfer of Care Note  Patient: Alex Carr  Procedure(s) Performed: SHOULDER ARTHROSCOPY WITH ROTATOR CUFF REPAIR (Right )  Patient Location: PACU  Anesthesia Type:General  Level of Consciousness: awake and alert   Airway & Oxygen Therapy: Patient connected to face mask  Post-op Assessment: Report given to RN  Post vital signs: stable  Last Vitals:  Vitals Value Taken Time  BP 111/68 11/30/2018 11:20 AM  Temp    Pulse 89 11/30/2018 11:24 AM  Resp 16 11/30/2018 11:24 AM  SpO2 100 % 11/30/2018 11:24 AM  Vitals shown include unvalidated device data.  Last Pain:  Vitals:   11/30/18 0853  TempSrc:   PainSc: 0-No pain         Complications: No apparent anesthesia complications

## 2018-11-30 NOTE — Anesthesia Procedure Notes (Signed)
Procedure Name: Intubation Performed by: Rona Ravens, CRNA Pre-anesthesia Checklist: Patient identified, Emergency Drugs available, Suction available and Patient being monitored Patient Re-evaluated:Patient Re-evaluated prior to induction Oxygen Delivery Method: Circle system utilized Preoxygenation: Pre-oxygenation with 100% oxygen Induction Type: IV induction Ventilation: Mask ventilation without difficulty Laryngoscope Size: Mac and 4 Grade View: Grade I Tube type: Oral Tube size: 7.0 mm Number of attempts: 1 Airway Equipment and Method: Rigid stylet Placement Confirmation: ETT inserted through vocal cords under direct vision,  positive ETCO2 and breath sounds checked- equal and bilateral Secured at: 23 cm Tube secured with: Tape Dental Injury: Teeth and Oropharynx as per pre-operative assessment

## 2018-11-30 NOTE — Anesthesia Procedure Notes (Signed)
Anesthesia Regional Block: Interscalene brachial plexus block   Pre-Anesthetic Checklist: ,, timeout performed, Correct Patient, Correct Site, Correct Laterality, Correct Procedure, Correct Position, site marked, Risks and benefits discussed,  Surgical consent,  Pre-op evaluation,  At surgeon's request and post-op pain management  Laterality: Right  Prep: chloraprep       Needles:  Injection technique: Single-shot  Needle Type: Stimiplex     Needle Length: 5cm  Needle Gauge: 22     Additional Needles:   Procedures:, nerve stimulator,,, ultrasound used (permanent image in chart),,,,   Nerve Stimulator or Paresthesia:  Response: biceps flexion, 0.8 mA,   Additional Responses:   Narrative:  Start time: 11/30/2018 8:34 AM End time: 11/30/2018 8:45 AM Injection made incrementally with aspirations every 5 mL.  Performed by: Personally   Additional Notes: Functioning IV was confirmed and monitors were applied.  A 18mm 22ga Stimuplex needle was used. Sterile prep and drape,hand hygiene and sterile gloves were used.  Negative aspiration and negative test dose prior to incremental administration of local anesthetic. The patient tolerated the procedure well.

## 2018-11-30 NOTE — H&P (Signed)
The patient has been re-examined, and the chart reviewed, and there have been no interval changes to the documented history and physical.  Plan a right shoulder arthroscopic rotator cuff repair today.  Anesthesia is consulted regarding a peripheral nerve block for post-operative pain.  The risks, benefits, and alternatives have been discussed at length, and the patient is willing to proceed.

## 2018-11-30 NOTE — Anesthesia Post-op Follow-up Note (Signed)
Anesthesia QCDR form completed.        

## 2018-11-30 NOTE — Addendum Note (Signed)
Addendum  created 11/30/18 1443 by Rona Ravens, CRNA   Charge Capture section accepted

## 2018-11-30 NOTE — Op Note (Addendum)
11/30/2018  11:07 AM  PATIENT:  Alex Carr  52 y.o. male  PRE-OPERATIVE DIAGNOSIS:  HISTORY OF ARTHROSCOPIC PROCEDURE ON SHOULDER, STRAIN OF MUSCLE AND TENDON OF ROTATOR CUFF OF RIGHT SHOULDER, PAIN IN RIGHT HAND  POST-OPERATIVE DIAGNOSIS:  HISTORY OF ARTHROSCOPIC PROCEDURE ON SHOULDER, STRAIN OF MUSCLE AND TENDON OF ROTATOR CUFF OF RIGHT SHOULDER, PAIN IN RIGHT HAND  PROCEDURE:  Procedure(s): SHOULDER ARTHROSCOPY WITH ROTATOR CUFF REPAIR (Right), manipulation of shoulder under anesthesia  SURGEON:  Surgeon(s) and Role:    Lovell Sheehan, MD - Primary  ASSIST: Carlynn Spry, PA-C  ANESTHESIA:   regional and general   PREOPERATIVE INDICATIONS:  Alex Carr is a  52 y.o. male with a diagnosis of HISTORY OF ARTHROSCOPIC PROCEDURE ON SHOULDER, STRAIN OF MUSCLE AND TENDON OF ROTATOR CUFF OF RIGHT SHOULDER, PAIN IN RIGHT HAND who failed conservative measures and elected for surgical management.    The risks benefits and alternatives were discussed with the patient preoperatively including but not limited to the risks of infection, bleeding, nerve injury, persistent pain or weakness, failure of the hardware, re-tear of the rotator cuff and the need for further surgery. Medical risks include DVT and pulmonary embolism, myocardial infarction, stroke, pneumonia, respiratory failure and death. Patient understood these risks and wished to proceed.  OPERATIVE IMPLANTS: ConMed Suture Bridge System, Y-not and CrossFT anchors  OPERATIVE PROCEDURE: The patient was met in the preoperative area. The right shoulder was signed with my initials according the hospital's correct site of surgery protocol. The patient is brought to the OR and underwent a supraclavicular block and general endotracheal intubation by the anesthesia service.  The patient was placed in a beachchair position. A spider arm positioner was used for this case. Examination under anesthesia revealed negative sulcus sign. He had  passive FF to 110 degrees and abduction of 110. The shoulder was manipulated to achieve 140 degrees of flexion and abduction..  The patient was prepped and draped in a sterile fashion. A timeout was performed to verify the patient's name, date of birth, medical record number, correct site of surgery and correct procedure to be performed there was also used to verify the patient received antibiotics that all appropriate instruments, implants and radiographs studies were available in the room. Once all in attendance were in agreement case began.  Bony landmarks were drawn out with a surgical marker along with proposed arthroscopy incisions. These were pre-injected with 1% lidocaine plain. An 11 blade was used to establish a posterior portal through which the arthroscope was placed in the glenohumeral joint. A full diagnostic examination of the shoulder was performed. The anterior portal was established under direct visualization with an 18-gauge spinal needle.  A 5.75 mm arthroscopic cannula was placed through the anterior portal.   Extensive synovitis was noted. The arthroscopic shaver was then used to debride the frayed edges of the labrum and remove the inflamed synovium. There were no anterior or superior labral tears seen.  Subscapularis tendon was intact. Patient had a full-thickness tear involving the supraspinatus and a portion of the infraspinatus with mild retraction. There were no loose bodies within the inferior recess and no evidence of HAGL lesion.  The arthroscope was then placed in the subacromial space. A lateral portal was then established using an 18-gauge spinal needle for localization.   The greater tuberosity was debrided using a 4.0 mm resector shaver blade to remove all remaining foreign fibers of the rotator cuff.  Debridement was performed until punctate bleeding was seen  at the greater tuberosity footprint, which will allow for rotator cuff healing.  Extensive bursitis was  encountered and debrided using a 4-0 resector shaver blade and a 90 ArthroCare wand from the lateral portal. Using the SpeedBridge system medial anchors with fiber tape were placed. The cuff was mobilized and the tape passed through the rotator cuff. The tape was then crossed in usual fashion and fixated on the lateral side with two crossLock FT anchors. The final construct was stable and moved as a unit with excellent coverage of the humeral head.  All incisions were copiously irrigated. Skin closure for the arthroscopic incisions was performed with 4-0 nylon.  A dry sterile dressing including Steri-Strips was applied . The patient was placed in an abduction sling.  All sharp and instrument counts were correct at the conclusion of the case. I was scrubbed and present for the entire case. I spoke with the patient's family in the post-op consultation room and informed them that the case had been performed without complication and the patient was stable in recovery room.   Kurtis Bushman, MD

## 2018-12-30 ENCOUNTER — Other Ambulatory Visit: Payer: Self-pay | Admitting: Internal Medicine

## 2018-12-30 DIAGNOSIS — K219 Gastro-esophageal reflux disease without esophagitis: Secondary | ICD-10-CM

## 2019-02-07 ENCOUNTER — Encounter: Payer: Self-pay | Admitting: Internal Medicine

## 2019-02-07 ENCOUNTER — Ambulatory Visit (INDEPENDENT_AMBULATORY_CARE_PROVIDER_SITE_OTHER): Payer: 59 | Admitting: Internal Medicine

## 2019-02-07 VITALS — BP 132/84 | HR 77 | Temp 97.9°F | Wt 233.0 lb

## 2019-02-07 DIAGNOSIS — Z85038 Personal history of other malignant neoplasm of large intestine: Secondary | ICD-10-CM | POA: Diagnosis not present

## 2019-02-07 DIAGNOSIS — R110 Nausea: Secondary | ICD-10-CM | POA: Diagnosis not present

## 2019-02-07 DIAGNOSIS — R1084 Generalized abdominal pain: Secondary | ICD-10-CM | POA: Diagnosis not present

## 2019-02-07 DIAGNOSIS — R140 Abdominal distension (gaseous): Secondary | ICD-10-CM

## 2019-02-07 LAB — LIPASE: Lipase: 10 U/L — ABNORMAL LOW (ref 11.0–59.0)

## 2019-02-07 LAB — CBC
HCT: 39.6 % (ref 39.0–52.0)
Hemoglobin: 13.2 g/dL (ref 13.0–17.0)
MCHC: 33.4 g/dL (ref 30.0–36.0)
MCV: 87.7 fl (ref 78.0–100.0)
Platelets: 242 10*3/uL (ref 150.0–400.0)
RBC: 4.51 Mil/uL (ref 4.22–5.81)
RDW: 15.6 % — ABNORMAL HIGH (ref 11.5–15.5)
WBC: 4.8 10*3/uL (ref 4.0–10.5)

## 2019-02-07 LAB — COMPREHENSIVE METABOLIC PANEL
ALBUMIN: 4 g/dL (ref 3.5–5.2)
ALT: 19 U/L (ref 0–53)
AST: 19 U/L (ref 0–37)
Alkaline Phosphatase: 131 U/L — ABNORMAL HIGH (ref 39–117)
BILIRUBIN TOTAL: 0.5 mg/dL (ref 0.2–1.2)
BUN: 15 mg/dL (ref 6–23)
CO2: 27 mEq/L (ref 19–32)
Calcium: 9.5 mg/dL (ref 8.4–10.5)
Chloride: 105 mEq/L (ref 96–112)
Creatinine, Ser: 0.92 mg/dL (ref 0.40–1.50)
GFR: 85.9 mL/min (ref >60.00)
Glucose, Bld: 108 mg/dL — ABNORMAL HIGH (ref 70–99)
Potassium: 4.5 mEq/L (ref 3.5–5.1)
Sodium: 139 mEq/L (ref 135–145)
Total Protein: 6.8 g/dL (ref 6.0–8.3)

## 2019-02-07 LAB — AMYLASE: Amylase: 57 U/L (ref 27–131)

## 2019-02-08 ENCOUNTER — Encounter: Payer: Self-pay | Admitting: Internal Medicine

## 2019-02-08 NOTE — Patient Instructions (Signed)

## 2019-02-08 NOTE — Progress Notes (Signed)
Subjective:    Patient ID: Alex Carr, male    DOB: Jul 09, 1966, 53 y.o.   MRN: [id number]  HPI  Pt presents to the clinic today with c/o abdominal pain. He reports it started 4-5 days ago. It started in the LUQ, and has moved to the RUQ, RLQ and LLQ. He describes the pain as sore and crampy with intermittent sharp shooting pains. The pain does not radiate into his back. He denies vomiting, constipation, diarrhea or blood in his stool. He has some associated nausea and bloating. He denis urinary complaints. He denies testicular pain or swelling. He denies fever, chills or body aches. He denies recent changes in diet or medications. He has tried an antacid with minimal relief. He has a history of GERD, on PPI but reports this feels different. He has a history of colon cancer, in remission.  Review of Systems      Past Medical History:  Diagnosis Date  . Arthritis   . Chicken pox   . Colon cancer ([place]) 2000  . History of kidney stones   . History of stomach ulcers   . Hx of colonic polyps     Current Outpatient Medications  Medication Sig Dispense Refill  . cyclobenzaprine (FLEXERIL) 10 MG tablet Take 10 mg by mouth at bedtime.    . docusate sodium (COLACE) 100 MG capsule Take 1 capsule (100 mg total) by mouth daily as needed. 30 capsule 2  . HYDROcodone-acetaminophen (NORCO/VICODIN) 5-325 MG tablet Take 1 tablet by mouth every 6 (six) hours as needed for moderate pain. 20 tablet 0  . Multiple Vitamin (MULTI-VITAMINS) TABS Take 1 tablet by mouth daily.     . naproxen (NAPROSYN) 375 MG tablet Take 1 tablet (375 mg total) by mouth 2 (two) times daily with a meal. SCHEDULE ANNUAL EXAM 180 tablet 0  . omeprazole (PRILOSEC) 40 MG capsule Take 1 capsule (40 mg total) by mouth daily. MUST SCHEDULE ANNUAL PHYSICAL 90 capsule 0  . pyridOXINE (VITAMIN B-6) 100 MG tablet Take 100 mg by mouth 2 (two) times daily.    . sildenafil (VIAGRA) 25 MG tablet Take 1 tablet (25 mg total) by mouth daily  as needed for erectile dysfunction. 30 tablet 2   No current facility-administered medications for this visit.     Allergies  Allergen Reactions  . Fluconazole Rash    Family History  Problem Relation Age of Onset  . Cancer Father        Cancerous Polyps  . Heart disease Father   . Hyperlipidemia Father   . Cancer Paternal Grandfather        Lung    Social History   Socioeconomic History  . Marital status: Married    Spouse name: Not on file  . Number of children: Not on file  . Years of education: Not on file  . Highest education level: Not on file  Occupational History  . Not on file  Social Needs  . Financial resource strain: Not on file  . Food insecurity:    Worry: Not on file    Inability: Not on file  . Transportation needs:    Medical: Not on file    Non-medical: Not on file  Tobacco Use  . Smoking status: Former Smoker    Last attempt to quit: 11/29/1998    Years since quitting: 20.2  . Smokeless tobacco: Never Used  . Tobacco comment: Quit in 1999  Substance and Sexual Activity  . Alcohol use: No  .  Drug use: No  . Sexual activity: Yes  Lifestyle  . Physical activity:    Days per week: Not on file    Minutes per session: Not on file  . Stress: Not on file  Relationships  . Social connections:    Talks on phone: Not on file    Gets together: Not on file    Attends religious service: Not on file    Active member of club or organization: Not on file    Attends meetings of clubs or organizations: Not on file    Relationship status: Not on file  . Intimate partner violence:    Fear of current or ex partner: Not on file    Emotionally abused: Not on file    Physically abused: Not on file    Forced sexual activity: Not on file  Other Topics Concern  . Not on file  Social History Narrative  . Not on file     Constitutional: Denies fever, malaise, fatigue, headache or abrupt weight changes.  Respiratory: Denies difficulty breathing, shortness  of breath, cough or sputum production.   Cardiovascular: Denies chest pain, chest tightness, palpitations or swelling in the hands or feet.  Gastrointestinal: Pt reports abdominal pain, bloating, nausea. Denies constipation, diarrhea or blood in the stool.  GU: Denies urgency, frequency, pain with urination, burning sensation, blood in urine, odor or discharge. Skin: Denies redness, rashes, lesions or ulcercations.    No other specific complaints in a complete review of systems (except as listed in HPI above).  Objective:   Physical Exam   BP 132/84   Pulse 77   Temp 97.9 F (36.6 C) (Oral)   Wt 233 lb (105.7 kg)   SpO2 98%   BMI 34.41 kg/m  Wt Readings from Last 3 Encounters:  02/07/19 233 lb (105.7 kg)  11/30/18 230 lb (104.3 kg)  11/29/18 230 lb (104.3 kg)    General: Appears his stated age, obese in NAD. Skin: Warm, dry and intact. Large vertical abdominal scar noted. Cardiovascular: Normal rate and rhythm. S1,S2 noted.  No murmur, rubs or gallops noted.  Pulmonary/Chest: Normal effort and positive vesicular breath sounds. No respiratory distress. No wheezes, rales or ronchi noted.  Abdomen: Soft and generally tender. Normal bowel sounds. No distention or masses noted. Negative Murphy's. Mild pain with palpation of Mcburney's point. No rebound tenderness.  Musculoskeletal: Negative psoas. No difficulty with gait.  Neurological: Alert and oriented.  Psychiatric: Anxious appearing  BMET    Component Value Date/Time   NA 139 02/07/2019 1205   NA 138 11/21/2014 1758   K 4.5 02/07/2019 1205   K 3.9 11/21/2014 1758   CL 105 02/07/2019 1205   CL 105 11/21/2014 1758   CO2 27 02/07/2019 1205   CO2 25 11/21/2014 1758   GLUCOSE 108 (H) 02/07/2019 1205   GLUCOSE 88 11/21/2014 1758   BUN 15 02/07/2019 1205   BUN 14 11/21/2014 1758   CREATININE 0.92 02/07/2019 1205   CREATININE 0.89 11/21/2014 1758   CALCIUM 9.5 02/07/2019 1205   CALCIUM 8.9 11/21/2014 1758   GFRNONAA >60  11/21/2014 1758   GFRAA >60 11/21/2014 1758    Lipid Panel     Component Value Date/Time   CHOL 157 01/11/2017 1520   TRIG 68.0 01/11/2017 1520   HDL 63.50 01/11/2017 1520   CHOLHDL 2 01/11/2017 1520   VLDL 13.6 01/11/2017 1520   LDLCALC 80 01/11/2017 1520    CBC    Component Value Date/Time  WBC 4.8 02/07/2019 1205   RBC 4.51 02/07/2019 1205   HGB 13.2 02/07/2019 1205   HGB 13.9 11/21/2014 1758   HCT 39.6 02/07/2019 1205   HCT 41.9 11/21/2014 1758   PLT 242.0 02/07/2019 1205   PLT 229 11/21/2014 1758   MCV 87.7 02/07/2019 1205   MCV 92 11/21/2014 1758   MCH 30.6 11/21/2014 1758   MCHC 33.4 02/07/2019 1205   RDW 15.6 (H) 02/07/2019 1205   RDW 13.1 11/21/2014 1758   LYMPHSABS 1.8 11/21/2014 1758   MONOABS 0.6 11/21/2014 1758   EOSABS 0.1 11/21/2014 1758   BASOSABS 0.0 11/21/2014 1758    Hgb A1C Lab Results  Component Value Date   HGBA1C 6.2 (A) 06/15/2018           Assessment & Plan:   Generalized Abdominal Pain, Nausea, Bloating:  Nonspecific exam Will check CBC, CMET, Amylase, Lipase and HPylori Continue PPI Will obtain CT scan abdomen with contrast r/o appendacitis Avoid NSAID's Consider follow up with GI if worse  Return/ER precautions discussed Webb Silversmith, NP

## 2019-02-09 ENCOUNTER — Ambulatory Visit
Admission: RE | Admit: 2019-02-09 | Discharge: 2019-02-09 | Disposition: A | Discharge status: Home / Self Care | Payer: 59 | Source: Ambulatory Visit | Attending: Internal Medicine | Admitting: Internal Medicine

## 2019-02-09 DIAGNOSIS — R1084 Generalized abdominal pain: Secondary | ICD-10-CM

## 2019-02-09 MED ORDER — IOHEXOL 300 MG/ML  SOLN
100.0000 mL | Freq: Once | INTRAMUSCULAR | Status: AC | PRN
  Administered 2019-02-09: 100 mL via INTRAVENOUS

## 2019-02-09 NOTE — Addendum Note (Signed)
Addended by: Ellamae Sia on: 02/09/2019 08:10 AM   Modules accepted: Orders

## 2019-02-13 ENCOUNTER — Other Ambulatory Visit: Payer: 59

## 2019-02-13 DIAGNOSIS — R140 Abdominal distension (gaseous): Secondary | ICD-10-CM

## 2019-02-13 DIAGNOSIS — R110 Nausea: Secondary | ICD-10-CM

## 2019-02-13 DIAGNOSIS — R1084 Generalized abdominal pain: Secondary | ICD-10-CM

## 2019-02-13 DIAGNOSIS — Z85038 Personal history of other malignant neoplasm of large intestine: Secondary | ICD-10-CM

## 2019-02-14 LAB — HELICOBACTER PYLORI  SPECIAL ANTIGEN
MICRO NUMBER:: 233463
SPECIMEN QUALITY: ADEQUATE

## 2019-02-15 ENCOUNTER — Encounter: Payer: Self-pay | Admitting: Internal Medicine

## 2019-02-27 ENCOUNTER — Ambulatory Visit (INDEPENDENT_AMBULATORY_CARE_PROVIDER_SITE_OTHER): Payer: 59 | Admitting: Internal Medicine

## 2019-02-27 ENCOUNTER — Encounter: Payer: Self-pay | Admitting: Internal Medicine

## 2019-02-27 VITALS — BP 132/86 | HR 91 | Temp 97.9°F | Ht 69.0 in | Wt 232.1 lb

## 2019-02-27 DIAGNOSIS — I10 Essential (primary) hypertension: Secondary | ICD-10-CM

## 2019-02-27 NOTE — Progress Notes (Signed)
Pre visit review using our clinic review tool, if applicable. No additional management support is needed unless otherwise documented below in the visit note. 

## 2019-02-27 NOTE — Patient Instructions (Signed)

## 2019-02-27 NOTE — Assessment & Plan Note (Signed)
?   Pain related due to ongoing issues with right shoulder He has repeat MRI today, will hold off on antihypertensive therapy until we see what MRI shows If persistently > 140/90, will let me know via mychart At that time, consider Lisinopril 10 mg daily and follow up after 2-3 weeks to repeat BP Encouraged DASH diet and exercise for weight loss  Will monitor for now

## 2019-02-27 NOTE — Progress Notes (Signed)
Subjective:    Patient ID: Alex Carr, male    DOB: [birth year], 53 y.o.   MRN: [id number]  HPI  Pt presents to the clinic today with c/o elevated blood pressure. He has noticed over the last few weeks, his BP has been running 130-140/80-90. He denies headaches, dizziness, lightheadedness, chest pain or shortness of breath. He has never been diagnosed with HTN in the past. He sustained a right rotator cuff tear 03/2018, underwent surgery/rehab and continues to have right shoulder pain. He has a repeat MRI. Although he is having pain, he reports this is not causing him stress or anxiety. He also feels like the blood pressure is not related to the pain because he has had pain a year and only started having elevated blood pressure in the last few weeks. 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  Review of Systems  Past Medical History:  Diagnosis Date  . Arthritis   . Chicken pox   . Colon cancer ([place]) 2000  . History of kidney stones   . History of stomach ulcers   . Hx of colonic polyps     Current Outpatient Medications  Medication Sig Dispense Refill  . cyclobenzaprine (FLEXERIL) 10 MG tablet Take 10 mg by mouth at bedtime.    . docusate sodium (COLACE) 100 MG capsule Take 1 capsule (100 mg total) by mouth daily as needed. 30 capsule 2  . HYDROcodone-acetaminophen (NORCO/VICODIN) 5-325 MG tablet Take 1 tablet by mouth every 6 (six) hours as needed for moderate pain. 20 tablet 0  . Multiple Vitamin (MULTI-VITAMINS) TABS Take 1 tablet by mouth daily.     . naproxen (NAPROSYN) 375 MG tablet Take 1 tablet (375 mg total) by mouth 2 (two) times daily with a meal. SCHEDULE ANNUAL EXAM 180 tablet 0  . omeprazole (PRILOSEC) 40 MG capsule Take 1 capsule (40 mg total) by mouth daily. MUST SCHEDULE ANNUAL PHYSICAL 90 capsule 0  . pyridOXINE (VITAMIN B-6) 100 MG tablet Take 100 mg by mouth 2 (two) times daily.    . sildenafil (VIAGRA) 25 MG tablet Take 1 tablet (25 mg total) by mouth  daily as needed for erectile dysfunction. 30 tablet 2   No current facility-administered medications for this visit.     Allergies  Allergen Reactions  . Fluconazole Rash    Family History  Problem Relation Age of Onset  . Cancer Father        Cancerous Polyps  . Heart disease Father   . Hyperlipidemia Father   . Cancer Paternal Grandfather        Lung    Social History   Socioeconomic History  . Marital status: Married    Spouse name: Not on file  . Number of children: Not on file  . Years of education: Not on file  . Highest education level: Not on file  Occupational History  . Not on file  Social Needs  . Financial resource strain: Not on file  . Food insecurity:    Worry: Not on file    Inability: Not on file  . Transportation needs:    Medical: Not on file    Non-medical: Not on file  Tobacco Use  . Smoking status: Former Smoker    Last attempt to quit: 11/29/1998    Years since quitting: 20.2  . Smokeless tobacco: Never Used  . Tobacco comment: Quit in 1999  Substance and Sexual Activity  . Alcohol use: No  .  Drug use: No  . Sexual activity: Yes  Lifestyle  . Physical activity:    Days per week: Not on file    Minutes per session: Not on file  . Stress: Not on file  Relationships  . Social connections:    Talks on phone: Not on file    Gets together: Not on file    Attends religious service: Not on file    Active member of club or organization: Not on file    Attends meetings of clubs or organizations: Not on file    Relationship status: Not on file  . Intimate partner violence:    Fear of current or ex partner: Not on file    Emotionally abused: Not on file    Physically abused: Not on file    Forced sexual activity: Not on file  Other Topics Concern  . Not on file  Social History Narrative  . Not on file     Constitutional: Denies fever, malaise, fatigue, headache or abrupt weight changes.  Respiratory: Denies difficulty breathing,  shortness of breath, cough or sputum production.   Cardiovascular: Denies chest pain, chest tightness, palpitations or swelling in the hands or feet.  Gastrointestinal: Denies abdominal pain, bloating, constipation, diarrhea or blood in the stool.  Neurological: Denies dizziness, difficulty with memory, difficulty with speech or problems with balance and coordination.  Psych: Denies anxiety, depression, SI/HI.  No other specific complaints in a complete review of systems (except as listed in HPI above).     Objective:   Physical Exam   BP 132/86 Comment: standing  Pulse 91   Temp 97.9 F (36.6 C)   Ht 5\' 9"  (1.753 m)   Wt 232 lb 1.9 oz (105.3 kg)   SpO2 97%   BMI 34.28 kg/m  Wt Readings from Last 3 Encounters:  02/27/19 232 lb 1.9 oz (105.3 kg)  02/07/19 233 lb (105.7 kg)  11/30/18 230 lb (104.3 kg)    General: Appears his stated age, obese, in NAD. Cardiovascular: Normal rate and rhythm. S1,S2 noted.  No murmur, rubs or gallops noted. No JVD or BLE edema.  Pulmonary/Chest: Normal effort and positive vesicular breath sounds. No respiratory distress. No wheezes, rales or ronchi noted.  Neurological: Alert and oriented.  Psychiatric: Mood and affect normal. Behavior is normal. Judgment and thought content normal.    BMET    Component Value Date/Time   NA 139 02/07/2019 1205   NA 138 11/21/2014 1758   K 4.5 02/07/2019 1205   K 3.9 11/21/2014 1758   CL 105 02/07/2019 1205   CL 105 11/21/2014 1758   CO2 27 02/07/2019 1205   CO2 25 11/21/2014 1758   GLUCOSE 108 (H) 02/07/2019 1205   GLUCOSE 88 11/21/2014 1758   BUN 15 02/07/2019 1205   BUN 14 11/21/2014 1758   CREATININE 0.92 02/07/2019 1205   CREATININE 0.89 11/21/2014 1758   CALCIUM 9.5 02/07/2019 1205   CALCIUM 8.9 11/21/2014 1758   GFRNONAA >60 11/21/2014 1758   GFRAA >60 11/21/2014 1758    Lipid Panel     Component Value Date/Time   CHOL 157 01/11/2017 1520   TRIG 68.0 01/11/2017 1520   HDL 63.50  01/11/2017 1520   CHOLHDL 2 01/11/2017 1520   VLDL 13.6 01/11/2017 1520   LDLCALC 80 01/11/2017 1520    CBC    Component Value Date/Time   WBC 4.8 02/07/2019 1205   RBC 4.51 02/07/2019 1205   HGB 13.2 02/07/2019 1205   HGB  13.9 11/21/2014 1758   HCT 39.6 02/07/2019 1205   HCT 41.9 11/21/2014 1758   PLT 242.0 02/07/2019 1205   PLT 229 11/21/2014 1758   MCV 87.7 02/07/2019 1205   MCV 92 11/21/2014 1758   MCH 30.6 11/21/2014 1758   MCHC 33.4 02/07/2019 1205   RDW 15.6 (H) 02/07/2019 1205   RDW 13.1 11/21/2014 1758   LYMPHSABS 1.8 11/21/2014 1758   MONOABS 0.6 11/21/2014 1758   EOSABS 0.1 11/21/2014 1758   BASOSABS 0.0 11/21/2014 1758    Hgb A1C Lab Results  Component Value Date   HGBA1C 6.2 (A) 06/15/2018           Assessment & Plan:

## 2019-03-11 ENCOUNTER — Telehealth: Payer: Self-pay | Admitting: Internal Medicine

## 2019-03-11 DIAGNOSIS — K219 Gastro-esophageal reflux disease without esophagitis: Secondary | ICD-10-CM

## 2019-05-22 ENCOUNTER — Encounter: Payer: Self-pay | Admitting: Internal Medicine

## 2019-08-31 ENCOUNTER — Other Ambulatory Visit: Payer: Self-pay

## 2019-08-31 DIAGNOSIS — Z20822 Contact with and (suspected) exposure to covid-19: Secondary | ICD-10-CM

## 2019-09-01 LAB — NOVEL CORONAVIRUS, NAA: SARS-CoV-2, NAA: NOT DETECTED

## 2019-12-11 ENCOUNTER — Encounter: Payer: Self-pay | Admitting: Internal Medicine

## 2019-12-12 MED ORDER — NAPROXEN 375 MG PO TABS: ORAL_TABLET | ORAL | 0 refills | Status: DC

## 2019-12-18 MED ORDER — NAPROXEN 375 MG PO TABS: ORAL_TABLET | ORAL | 0 refills | Status: DC

## 2019-12-18 NOTE — Telephone Encounter (Signed)
Will refill, but needs to schedule CPE, has been 2 years.

## 2019-12-18 NOTE — Addendum Note (Signed)
Addended by: Jearld Fenton on: 12/18/2019 03:17 PM   Modules accepted: Orders

## 2019-12-18 NOTE — Telephone Encounter (Signed)
Patient's wife left a voicemail stating that they requested a refill on his Naproxen about two weeks ago. Mrs. Schremp stated that they requested that this be sent to [facility] and they have not gotten it yet?

## 2020-07-08 ENCOUNTER — Encounter: Payer: Self-pay | Admitting: Internal Medicine

## 2020-12-23 ENCOUNTER — Other Ambulatory Visit: Payer: Self-pay | Admitting: Internal Medicine

## 2020-12-27 MED ORDER — NAPROXEN 375 MG PO TABS: ORAL_TABLET | ORAL | 0 refills | Status: DC

## 2021-02-04 ENCOUNTER — Ambulatory Visit: Payer: Self-pay | Admitting: Internal Medicine

## 2021-02-04 ENCOUNTER — Other Ambulatory Visit: Payer: Self-pay

## 2021-02-04 ENCOUNTER — Encounter: Payer: Self-pay | Admitting: Internal Medicine

## 2021-02-04 DIAGNOSIS — N529 Male erectile dysfunction, unspecified: Secondary | ICD-10-CM

## 2021-02-04 DIAGNOSIS — C189 Malignant neoplasm of colon, unspecified: Secondary | ICD-10-CM

## 2021-02-04 DIAGNOSIS — I10 Essential (primary) hypertension: Secondary | ICD-10-CM

## 2021-02-04 DIAGNOSIS — K219 Gastro-esophageal reflux disease without esophagitis: Secondary | ICD-10-CM

## 2021-02-04 MED ORDER — SILDENAFIL CITRATE 25 MG PO TABS: 25.0000 mg | ORAL_TABLET | Freq: Every day | ORAL | 11 refills | Status: DC | PRN

## 2021-02-04 MED ORDER — OMEPRAZOLE 40 MG PO CPDR: 40.0000 mg | DELAYED_RELEASE_CAPSULE | Freq: Every day | ORAL | 3 refills | Status: DC

## 2021-02-04 NOTE — Assessment & Plan Note (Signed)
In remission He thinks he had a colonoscopy in 2019, will go home and check- 5 year recall

## 2021-02-04 NOTE — Progress Notes (Signed)
Subjective:    Patient ID: Alex Carr, male    DOB: April 16, 1966, 55 y.o.   MRN: [id number]  HPI  Pt presents to the clinic today to follow up chronic conditions.  Hx of Colon Cancer: s/p surgical intervention, and chemo, no radiation. Colonoscopy from 12/2014 reviewed.  GERD: He denies breakthrough on Omeprazole. There is no upper GI on file.  HTN: His BP today is 138/94. He is not taking any antihypertensive medications at this time. ECG from 05/2018 reviewed.  OA: Mainly in his shoulder and back. He takes Naproxen as needed with good relief of symptoms.  ED: Managed with Sildenafil. He does not follow with urology.    Review of Systems      Past Medical History:  Diagnosis Date  . Arthritis   . Chicken pox   . Colon cancer ([place]) 2000  . History of kidney stones   . History of stomach ulcers   . Hx of colonic polyps     Current Outpatient Medications  Medication Sig Dispense Refill  . cyclobenzaprine (FLEXERIL) 10 MG tablet Take 10 mg by mouth at bedtime.    . Multiple Vitamin (MULTI-VITAMINS) TABS Take 1 tablet by mouth daily.     . naproxen (NAPROSYN) 375 MG tablet TAKE 1 TABLET BY MOUTH  TWICE A DAY WITH A MEAL 60 tablet 0  . omeprazole (PRILOSEC) 40 MG capsule TAKE 1 CAPSULE BY MOUTH  DAILY 90 capsule 0  . pyridOXINE (VITAMIN B-6) 100 MG tablet Take 100 mg by mouth 2 (two) times daily.    . sildenafil (VIAGRA) 25 MG tablet Take 1 tablet (25 mg total) by mouth daily as needed for erectile dysfunction. 30 tablet 2   No current facility-administered medications for this visit.    Allergies  Allergen Reactions  . Fluconazole Rash    Family History  Problem Relation Age of Onset  . Cancer Father        Cancerous Polyps  . Heart disease Father   . Hyperlipidemia Father   . Cancer Paternal Grandfather        Lung    Social History   Socioeconomic History  . Marital status: Married    Spouse name: Not on file  . Number of children: Not on file  .  Years of education: Not on file  . Highest education level: Not on file  Occupational History  . Not on file  Tobacco Use  . Smoking status: Former Smoker    Quit date: 11/29/1998    Years since quitting: 22.2  . Smokeless tobacco: Never Used  . Tobacco comment: Quit in 1999  Vaping Use  . Vaping Use: Never used  Substance and Sexual Activity  . Alcohol use: No  . Drug use: No  . Sexual activity: Yes  Other Topics Concern  . Not on file  Social History Narrative  . Not on file   Social Determinants of Health   Financial Resource Strain: Not on file  Food Insecurity: Not on file  Transportation Needs: Not on file  Physical Activity: Not on file  Stress: Not on file  Social Connections: Not on file  Intimate Partner Violence: Not on file     Constitutional: Denies fever, malaise, fatigue, headache or abrupt weight changes.  HEENT: Denies eye pain, eye redness, ear pain, ringing in the ears, wax buildup, runny nose, nasal congestion, bloody nose, or sore throat. Respiratory: Denies difficulty breathing, shortness of breath, cough or sputum production.   Cardiovascular: Denies  chest pain, chest tightness, palpitations or swelling in the hands or feet.  Gastrointestinal: Denies abdominal pain, bloating, constipation, diarrhea or blood in the stool.  GU: Pt reports erectile dysfunction. Denies urgency, frequency, pain with urination, burning sensation, blood in urine, odor or discharge. Musculoskeletal: Pt reports intermittent joint pain. Denies decrease in range of motion, difficulty with gait, muscle pain or joint swelling.  Skin: Denies redness, rashes, lesions or ulcercations.  Neurological: Denies dizziness, difficulty with memory, difficulty with speech or problems with balance and coordination.  Psych: Denies anxiety, depression, SI/HI.  No other specific complaints in a complete review of systems (except as listed in HPI above).  Objective:   Physical Exam  BP (!)  138/94   Pulse 74   Temp (!) 97.2 F (36.2 C) (Temporal)   Wt 232 lb (105.2 kg)   SpO2 98%   BMI 34.26 kg/m    Wt Readings from Last 3 Encounters:  02/27/19 232 lb 1.9 oz (105.3 kg)  02/07/19 233 lb (105.7 kg)  11/30/18 230 lb (104.3 kg)    General: Appears his stated age, obese, in NAD. HEENT: Head: normal shape and size; Eyes: sclera white, no icterus, conjunctiva pink, PERRLA and EOMs intact;  Cardiovascular: Normal rate and rhythm. S1,S2 noted.  No murmur, rubs or gallops noted. No JVD or BLE edema. No carotid bruits noted. Pulmonary/Chest: Normal effort and positive vesicular breath sounds. No respiratory distress. No wheezes, rales or ronchi noted.  Musculoskeletal:  No difficulty with gait.  Neurological: Alert and oriented.  Psychiatric: Mood and affect normal. Behavior is normal. Judgment and thought content normal.     BMET    Component Value Date/Time   NA 139 02/07/2019 1205   NA 138 11/21/2014 1758   K 4.5 02/07/2019 1205   K 3.9 11/21/2014 1758   CL 105 02/07/2019 1205   CL 105 11/21/2014 1758   CO2 27 02/07/2019 1205   CO2 25 11/21/2014 1758   GLUCOSE 108 (H) 02/07/2019 1205   GLUCOSE 88 11/21/2014 1758   BUN 15 02/07/2019 1205   BUN 14 11/21/2014 1758   CREATININE 0.92 02/07/2019 1205   CREATININE 0.89 11/21/2014 1758   CALCIUM 9.5 02/07/2019 1205   CALCIUM 8.9 11/21/2014 1758   GFRNONAA >60 11/21/2014 1758   GFRAA >60 11/21/2014 1758    Lipid Panel     Component Value Date/Time   CHOL 157 01/11/2017 1520   TRIG 68.0 01/11/2017 1520   HDL 63.50 01/11/2017 1520   CHOLHDL 2 01/11/2017 1520   VLDL 13.6 01/11/2017 1520   LDLCALC 80 01/11/2017 1520    CBC    Component Value Date/Time   WBC 4.8 02/07/2019 1205   RBC 4.51 02/07/2019 1205   HGB 13.2 02/07/2019 1205   HGB 13.9 11/21/2014 1758   HCT 39.6 02/07/2019 1205   HCT 41.9 11/21/2014 1758   PLT 242.0 02/07/2019 1205   PLT 229 11/21/2014 1758   MCV 87.7 02/07/2019 1205   MCV 92  11/21/2014 1758   MCH 30.6 11/21/2014 1758   MCHC 33.4 02/07/2019 1205   RDW 15.6 (H) 02/07/2019 1205   RDW 13.1 11/21/2014 1758   LYMPHSABS 1.8 11/21/2014 1758   MONOABS 0.6 11/21/2014 1758   EOSABS 0.1 11/21/2014 1758   BASOSABS 0.0 11/21/2014 1758    Hgb A1C Lab Results  Component Value Date   HGBA1C 6.2 (A) 06/15/2018          Assessment & Plan:   Webb Silversmith, NP This visit  occurred during the SARS-CoV-2 public health emergency.  Safety protocols were in place, including screening questions prior to the visit, additional usage of staff PPE, and extensive cleaning of exam room while observing appropriate contact time as indicated for disinfecting solutions.

## 2021-02-04 NOTE — Patient Instructions (Signed)

## 2021-02-04 NOTE — Assessment & Plan Note (Signed)
Avoid foods that trigger your reflux Discussed how weight loss could help reduce reflux symptoms Omeprazole refilled today

## 2021-02-04 NOTE — Assessment & Plan Note (Signed)
Sildenafil refilled today 

## 2021-02-04 NOTE — Assessment & Plan Note (Signed)
He declines RX intervention at this time Will monitor closely If > 140/90, will need to consider antihypertensive therapy Reinforced DASH diet and exercise for weight loss

## 2021-02-20 ENCOUNTER — Other Ambulatory Visit: Payer: Self-pay | Admitting: Internal Medicine

## 2021-02-20 ENCOUNTER — Encounter: Payer: Self-pay | Admitting: Internal Medicine

## 2021-02-20 MED ORDER — NAPROXEN 375 MG PO TABS: ORAL_TABLET | ORAL | 0 refills | Status: DC

## 2021-07-17 ENCOUNTER — Other Ambulatory Visit: Payer: Self-pay | Admitting: Internal Medicine

## 2021-07-24 ENCOUNTER — Encounter: Payer: Self-pay | Admitting: Internal Medicine

## 2021-07-24 MED ORDER — AMOXICILLIN 875 MG PO TABS: 875.0000 mg | ORAL_TABLET | Freq: Two times a day (BID) | ORAL | 0 refills | Status: DC

## 2021-11-20 ENCOUNTER — Other Ambulatory Visit: Payer: Self-pay | Admitting: Internal Medicine

## 2021-11-20 DIAGNOSIS — K219 Gastro-esophageal reflux disease without esophagitis: Secondary | ICD-10-CM

## 2021-11-21 NOTE — Telephone Encounter (Signed)
Requested medication (s) are due for refill today: NO, just filled 10/21/21 for 90 day supply  Requested medication (s) are on the active medication list: yes  Last refill:  10/21/21 (90)  Last visit 01/2021  Future visit scheduled: NO  Notes to clinic:  requesting too soon, no upcoming appt.      Requested Prescriptions  Pending Prescriptions Disp Refills   omeprazole (PRILOSEC) 40 MG capsule [Pharmacy Med Name: Omeprazole 40 MG Oral Capsule Delayed Release] 90 capsule 0    Sig: Take 1 capsule by mouth once daily     There is no refill protocol information for this order

## 2021-12-25 ENCOUNTER — Other Ambulatory Visit: Payer: Self-pay | Admitting: Family

## 2022-01-29 ENCOUNTER — Encounter: Payer: Self-pay | Admitting: Internal Medicine

## 2022-01-29 DIAGNOSIS — K219 Gastro-esophageal reflux disease without esophagitis: Secondary | ICD-10-CM

## 2022-01-29 MED ORDER — OMEPRAZOLE 40 MG PO CPDR: 40.0000 mg | DELAYED_RELEASE_CAPSULE | Freq: Every day | ORAL | 0 refills | Status: DC

## 2022-03-02 ENCOUNTER — Ambulatory Visit: Payer: Self-pay | Admitting: Internal Medicine

## 2022-03-02 ENCOUNTER — Encounter: Payer: Self-pay | Admitting: Internal Medicine

## 2022-03-02 ENCOUNTER — Other Ambulatory Visit: Payer: Self-pay

## 2022-03-02 VITALS — BP 196/98 | HR 66 | Temp 97.5°F | Wt 226.0 lb

## 2022-03-02 DIAGNOSIS — Z6833 Body mass index (BMI) 33.0-33.9, adult: Secondary | ICD-10-CM | POA: Insufficient documentation

## 2022-03-02 DIAGNOSIS — Z85038 Personal history of other malignant neoplasm of large intestine: Secondary | ICD-10-CM | POA: Insufficient documentation

## 2022-03-02 DIAGNOSIS — M1990 Unspecified osteoarthritis, unspecified site: Secondary | ICD-10-CM | POA: Insufficient documentation

## 2022-03-02 DIAGNOSIS — I10 Essential (primary) hypertension: Secondary | ICD-10-CM

## 2022-03-02 DIAGNOSIS — E663 Overweight: Secondary | ICD-10-CM | POA: Insufficient documentation

## 2022-03-02 DIAGNOSIS — E1165 Type 2 diabetes mellitus with hyperglycemia: Secondary | ICD-10-CM

## 2022-03-02 DIAGNOSIS — N528 Other male erectile dysfunction: Secondary | ICD-10-CM

## 2022-03-02 DIAGNOSIS — K219 Gastro-esophageal reflux disease without esophagitis: Secondary | ICD-10-CM

## 2022-03-02 DIAGNOSIS — E119 Type 2 diabetes mellitus without complications: Secondary | ICD-10-CM | POA: Insufficient documentation

## 2022-03-02 DIAGNOSIS — Z6826 Body mass index (BMI) 26.0-26.9, adult: Secondary | ICD-10-CM | POA: Insufficient documentation

## 2022-03-02 DIAGNOSIS — E6609 Other obesity due to excess calories: Secondary | ICD-10-CM | POA: Insufficient documentation

## 2022-03-02 DIAGNOSIS — M159 Polyosteoarthritis, unspecified: Secondary | ICD-10-CM

## 2022-03-02 LAB — POCT GLYCOSYLATED HEMOGLOBIN (HGB A1C): Hemoglobin A1C: 8.2 % — AB (ref 4.0–5.6)

## 2022-03-02 MED ORDER — TADALAFIL 20 MG PO TABS: 10.0000 mg | ORAL_TABLET | ORAL | 1 refills | Status: DC | PRN

## 2022-03-02 MED ORDER — OMEPRAZOLE 40 MG PO CPDR: 40.0000 mg | DELAYED_RELEASE_CAPSULE | Freq: Every day | ORAL | 1 refills | Status: DC

## 2022-03-02 MED ORDER — NAPROXEN 375 MG PO TABS: 375.0000 mg | ORAL_TABLET | Freq: Two times a day (BID) | ORAL | 0 refills | Status: DC

## 2022-03-02 MED ORDER — LOSARTAN POTASSIUM-HCTZ 50-12.5 MG PO TABS: 1.0000 | ORAL_TABLET | Freq: Every day | ORAL | 0 refills | Status: DC

## 2022-03-02 NOTE — Assessment & Plan Note (Signed)
Uncontrolled off meds ?Will start Losartan HCT ?Reinforced DASH diet and exercise for weight loss ? ?RTC in 2 weeks for nurse visit BP check ?

## 2022-03-02 NOTE — Assessment & Plan Note (Signed)
Continue Naproxen, refilled today ?Encouraged weight loss as this can help reduce joint pain ?

## 2022-03-02 NOTE — Patient Instructions (Signed)

## 2022-03-02 NOTE — Assessment & Plan Note (Signed)
Encouraged weight loss as this can help reduce reflux symptoms ?Omeprazole refilled today ?

## 2022-03-02 NOTE — Assessment & Plan Note (Signed)
POCT A1C 8.2 % ?He would like to take 3 months to work on lifestyle changes ?Encouraged low carb diet, exercise for weight loss ?He will monitor blood sugars at home ?

## 2022-03-02 NOTE — Assessment & Plan Note (Signed)
In remission ?Will follow colonoscopy's ?

## 2022-03-02 NOTE — Assessment & Plan Note (Signed)
Encouraged low carb diet and exercise for weight loss 

## 2022-03-02 NOTE — Progress Notes (Signed)
? ?Subjective:  ? ? Patient ID: Alex Carr, male    DOB: 01/11/1966, 56 y.o.   MRN: [id number] ? ?HPI ? ?Patient presents to clinic today for follow-up of chronic conditions. ? ?History of Colon Cancer: Status postsurgical intervention, chemo, no radiation.  Colonoscopy from 12/2014 reviewed. ? ?GERD: He is not sure what triggers this. He denies breakthrough on Omeprazole.  There is no upper GI on file. ? ?HTN: His BP today is 181/87.  He is not taking any antihypertensive medications at this time.  ECG from 05/2018 reviewed. ? ?OA: Mainly in his shoulders and back.  He takes Naproxen as needed with good relief of symptoms. ? ?ED: Managed with Sildenafil. He would like to switch to Cialis because it is cheaper.  He does not follow with urology. ? ?Prediabetes: His last A1c was 6.2%, 2019.  He is not taking any oral diabetic medication at this time.  He does not check his sugars. ? ?Review of Systems ? ?   ?Past Medical History:  ?Diagnosis Date  ? Arthritis   ? Chicken pox   ? Colon cancer ([place]) 2000  ? History of kidney stones   ? History of stomach ulcers   ? Hx of colonic polyps   ? ? ?Current Outpatient Medications  ?Medication Sig Dispense Refill  ? amoxicillin (AMOXIL) 875 MG tablet Take 1 tablet (875 mg total) by mouth 2 (two) times daily. 20 tablet 0  ? naproxen (NAPROSYN) 375 MG tablet TAKE 1 TABLET BY MOUTH TWICE DAILY WITH A MEAL 180 tablet 0  ? omeprazole (PRILOSEC) 40 MG capsule Take 1 capsule (40 mg total) by mouth daily. Please schedule an office visit before anymore refills. 30 capsule 0  ? pyridOXINE (VITAMIN B-6) 100 MG tablet Take 100 mg by mouth 2 (two) times daily.    ? sildenafil (VIAGRA) 25 MG tablet Take 1 tablet (25 mg total) by mouth daily as needed for erectile dysfunction. 10 tablet 11  ? Zinc 25 MG TABS Take by mouth.    ? ?No current facility-administered medications for this visit.  ? ? ?Allergies  ?Allergen Reactions  ? Fluconazole Rash  ? ? ?Family History  ?Problem Relation Age of  Onset  ? Cancer Father   ?     Cancerous Polyps  ? Heart disease Father   ? Hyperlipidemia Father   ? Cancer Paternal Grandfather   ?     Lung  ? ? ?Social History  ? ?Socioeconomic History  ? Marital status: Married  ?  Spouse name: Not on file  ? Number of children: Not on file  ? Years of education: Not on file  ? Highest education level: Not on file  ?Occupational History  ? Not on file  ?Tobacco Use  ? Smoking status: Former  ?  Types: Cigarettes  ?  Quit date: 11/29/1998  ?  Years since quitting: 23.2  ? Smokeless tobacco: Never  ? Tobacco comments:  ?  Quit in 1999  ?Vaping Use  ? Vaping Use: Never used  ?Substance and Sexual Activity  ? Alcohol use: No  ? Drug use: No  ? Sexual activity: Yes  ?Other Topics Concern  ? Not on file  ?Social History Narrative  ? Not on file  ? ?Social Determinants of Health  ? ?Financial Resource Strain: Not on file  ?Food Insecurity: Not on file  ?Transportation Needs: Not on file  ?Physical Activity: Not on file  ?Stress: Not on file  ?Social Connections: Not  on file  ?Intimate Partner Violence: Not on file  ? ? ? ?Constitutional: Denies fever, malaise, fatigue, headache or abrupt weight changes.  ?HEENT: Denies eye pain, eye redness, ear pain, ringing in the ears, wax buildup, runny nose, nasal congestion, bloody nose, or sore throat. ?Respiratory: Denies difficulty breathing, shortness of breath, cough or sputum production.   ?Cardiovascular: Denies chest pain, chest tightness, palpitations or swelling in the hands or feet.  ?Gastrointestinal: Denies abdominal pain, bloating, constipation, diarrhea or blood in the stool.  ?GU: Patient reports erectile dysfunction.  Denies urgency, frequency, pain with urination, burning sensation, blood in urine, odor or discharge. ?Musculoskeletal: Patient reports intermittent joint pain.  Denies decrease in range of motion, difficulty with gait, muscle pain or joint swelling.  ?Skin: Denies redness, rashes, lesions or ulcercations.   ?Neurological: Denies dizziness, difficulty with memory, difficulty with speech or problems with balance and coordination.  ?Psych: Denies anxiety, depression, SI/HI. ? ?No other specific complaints in a complete review of systems (except as listed in HPI above). ? ?Objective:  ? Physical Exam ?BP (!) 196/98 (BP Location: Right Arm, Patient Position: Sitting, Cuff Size: Large)   Pulse 66   Temp (!) 97.5 ?F (36.4 ?C) (Temporal)   Wt 226 lb (102.5 kg)   SpO2 100%   BMI 33.37 kg/m?  ? ?Wt Readings from Last 3 Encounters:  ?02/04/21 232 lb (105.2 kg)  ?02/27/19 232 lb 1.9 oz (105.3 kg)  ?02/07/19 233 lb (105.7 kg)  ? ? ?General: Appears his stated age,obese, in NAD. ?Skin: Warm, dry and intact.  ?HEENT: Head: normal shape and size; Eyes: EOMs intact;  ?Cardiovascular: Normal rate and rhythm. S1,S2 noted.  No murmur, rubs or gallops noted. No JVD or BLE edema. No carotid bruits noted. ?Pulmonary/Chest: Normal effort and positive vesicular breath sounds. No respiratory distress. No wheezes, rales or ronchi noted.  ?Abdomen: Normal bowel sounds.  ?Musculoskeletal:  No difficulty with gait.  ?Neurological: Alert and oriented. Coordination normal.  ?Psychiatric: Mood and affect normal. Behavior is normal. Judgment and thought content normal.  ? ? ?BMET ?   ?Component Value Date/Time  ? NA 139 02/07/2019 1205  ? NA 138 11/21/2014 1758  ? K 4.5 02/07/2019 1205  ? K 3.9 11/21/2014 1758  ? CL 105 02/07/2019 1205  ? CL 105 11/21/2014 1758  ? CO2 27 02/07/2019 1205  ? CO2 25 11/21/2014 1758  ? GLUCOSE 108 (H) 02/07/2019 1205  ? GLUCOSE 88 11/21/2014 1758  ? BUN 15 02/07/2019 1205  ? BUN 14 11/21/2014 1758  ? CREATININE 0.92 02/07/2019 1205  ? CREATININE 0.89 11/21/2014 1758  ? CALCIUM 9.5 02/07/2019 1205  ? CALCIUM 8.9 11/21/2014 1758  ? GFRNONAA >60 11/21/2014 1758  ? GFRAA >60 11/21/2014 1758  ? ? ?Lipid Panel  ?   ?Component Value Date/Time  ? CHOL 157 01/11/2017 1520  ? TRIG 68.0 01/11/2017 1520  ? HDL 63.50 01/11/2017  1520  ? CHOLHDL 2 01/11/2017 1520  ? VLDL 13.6 01/11/2017 1520  ? [place] 80 01/11/2017 1520  ? ? ?CBC ?   ?Component Value Date/Time  ? WBC 4.8 02/07/2019 1205  ? RBC 4.51 02/07/2019 1205  ? HGB 13.2 02/07/2019 1205  ? HGB 13.9 11/21/2014 1758  ? HCT 39.6 02/07/2019 1205  ? HCT 41.9 11/21/2014 1758  ? PLT 242.0 02/07/2019 1205  ? PLT 229 11/21/2014 1758  ? MCV 87.7 02/07/2019 1205  ? MCV 92 11/21/2014 1758  ? MCH 30.6 11/21/2014 1758  ? MCHC 33.4  02/07/2019 1205  ? RDW 15.6 (H) 02/07/2019 1205  ? RDW 13.1 11/21/2014 1758  ? LYMPHSABS 1.8 11/21/2014 1758  ? MONOABS 0.6 11/21/2014 1758  ? EOSABS 0.1 11/21/2014 1758  ? BASOSABS 0.0 11/21/2014 1758  ? ? ?Hgb A1C ?Lab Results  ?Component Value Date  ? HGBA1C 6.2 (A) 06/15/2018  ? ? ? ? ? ? ? ?   ?Assessment & Plan:  ? ? ? ?Webb Silversmith, NP ?This visit occurred during the SARS-CoV-2 public health emergency.  Safety protocols were in place, including screening questions prior to the visit, additional usage of staff PPE, and extensive cleaning of exam room while observing appropriate contact time as indicated for disinfecting solutions.  ? ?

## 2022-03-02 NOTE — Assessment & Plan Note (Signed)
Will d/c Sildenafil and start Cialis ?

## 2022-03-09 ENCOUNTER — Encounter: Payer: Self-pay | Admitting: Internal Medicine

## 2022-03-16 ENCOUNTER — Ambulatory Visit: Payer: Medicaid Other

## 2022-03-16 ENCOUNTER — Other Ambulatory Visit: Payer: Self-pay

## 2022-03-16 VITALS — BP 136/84

## 2022-03-16 DIAGNOSIS — I10 Essential (primary) hypertension: Secondary | ICD-10-CM

## 2022-03-16 NOTE — Progress Notes (Signed)
Pt is here today for a BP check.  He stared losartan-hctz 50/12.'5mg'$ .  Pt report having side effects so reduced his BP to 1/2 tablet daily and is feeling much better.   ? ?146/80  Left arm ? ?136/84 Right arm ?

## 2022-03-23 ENCOUNTER — Encounter: Payer: Self-pay | Admitting: Internal Medicine

## 2022-04-02 ENCOUNTER — Ambulatory Visit (INDEPENDENT_AMBULATORY_CARE_PROVIDER_SITE_OTHER): Payer: 59 | Admitting: Internal Medicine

## 2022-04-02 ENCOUNTER — Encounter: Payer: Self-pay | Admitting: Internal Medicine

## 2022-04-02 VITALS — BP 118/78 | HR 80 | Temp 97.7°F | Ht 69.0 in | Wt 206.0 lb

## 2022-04-02 DIAGNOSIS — R5383 Other fatigue: Secondary | ICD-10-CM

## 2022-04-02 DIAGNOSIS — E6609 Other obesity due to excess calories: Secondary | ICD-10-CM

## 2022-04-02 DIAGNOSIS — Z6830 Body mass index (BMI) 30.0-30.9, adult: Secondary | ICD-10-CM

## 2022-04-02 DIAGNOSIS — I10 Essential (primary) hypertension: Secondary | ICD-10-CM

## 2022-04-02 DIAGNOSIS — R42 Dizziness and giddiness: Secondary | ICD-10-CM | POA: Diagnosis not present

## 2022-04-02 NOTE — Progress Notes (Signed)
? ?Subjective:  ? ? Patient ID: Alex Carr, male    DOB: Apr 21, 1966, 56 y.o.   MRN: [id number] ? ?HPI ? ?Patient presents to clinic today with complaint of fatigue and lightheadedness.  This started about 2 weeks.  He was having some lower blood pressures so we changed his Losartan HCT to one half tab equaling 25-6.25 MGs daily.  During that time, he had been working on lifestyle changes and lost 25 pounds.  He continued to complain of fatigue and lightheadedness.  I advised him to hold his Losartan HCT for the last week.  His BP today is 188/78. He reports the lightheadedness with position changes but mostly when he bending forward to get something. ECG from 05/2018 reviewed.  He has recently been diagnosed with diabetes, A1c 8.2% but his fasting sugars have generally been <120. ? ?Review of Systems ?   ? ?Past Medical History:  ?Diagnosis Date  ? Arthritis   ? Chicken pox   ? Colon cancer ([place]) 2000  ? History of kidney stones   ? History of stomach ulcers   ? Hx of colonic polyps   ? ? ?Current Outpatient Medications  ?Medication Sig Dispense Refill  ? losartan-hydrochlorothiazide (HYZAAR) 50-12.5 MG tablet Take 1 tablet by mouth daily. 90 tablet 0  ? naproxen (NAPROSYN) 375 MG tablet Take 1 tablet (375 mg total) by mouth 2 (two) times daily with a meal. 180 tablet 0  ? omeprazole (PRILOSEC) 40 MG capsule Take 1 capsule (40 mg total) by mouth daily. 90 capsule 1  ? pyridOXINE (VITAMIN B-6) 100 MG tablet Take 100 mg by mouth 2 (two) times daily.    ? tadalafil (CIALIS) 20 MG tablet Take 0.5-1 tablets (10-20 mg total) by mouth every other day as needed for erectile dysfunction. 30 tablet 1  ? Zinc 25 MG TABS Take by mouth.    ? ?No current facility-administered medications for this visit.  ? ? ?Allergies  ?Allergen Reactions  ? Fluconazole Rash  ? ? ?Family History  ?Problem Relation Age of Onset  ? Cancer Father   ?     Cancerous Polyps  ? Heart disease Father   ? Hyperlipidemia Father   ? Cancer Paternal  Grandfather   ?     Lung  ? ? ?Social History  ? ?Socioeconomic History  ? Marital status: Married  ?  Spouse name: Not on file  ? Number of children: Not on file  ? Years of education: Not on file  ? Highest education level: Not on file  ?Occupational History  ? Not on file  ?Tobacco Use  ? Smoking status: Former  ?  Types: Cigarettes  ?  Quit date: 11/29/1998  ?  Years since quitting: 23.3  ? Smokeless tobacco: Never  ? Tobacco comments:  ?  Quit in 1999  ?Vaping Use  ? Vaping Use: Never used  ?Substance and Sexual Activity  ? Alcohol use: No  ? Drug use: No  ? Sexual activity: Yes  ?Other Topics Concern  ? Not on file  ?Social History Narrative  ? Not on file  ? ?Social Determinants of Health  ? ?Financial Resource Strain: Not on file  ?Food Insecurity: Not on file  ?Transportation Needs: Not on file  ?Physical Activity: Not on file  ?Stress: Not on file  ?Social Connections: Not on file  ?Intimate Partner Violence: Not on file  ? ? ? ?Constitutional: Patient reports fatigue.  Denies fever, malaise, headache or abrupt weight changes.  ?  HEENT: Denies eye pain, eye redness, ear pain, ringing in the ears, wax buildup, runny nose, nasal congestion, bloody nose, or sore throat. ?Respiratory: Denies difficulty breathing, shortness of breath, cough or sputum production.   ?Cardiovascular: Denies chest pain, chest tightness, palpitations or swelling in the hands or feet.  ?Musculoskeletal: Denies decrease in range of motion, difficulty with gait, muscle pain or joint pain and swelling.  ?Skin: Denies redness, rashes, lesions or ulcercations.  ?Neurological: Patient reports lightheadedness.  Denies difficulty with memory, difficulty with speech or problems with balance and coordination.  ? ? ?No other specific complaints in a complete review of systems (except as listed in HPI above). ? ?Objective:  ? Physical Exam ? ?BP 118/78 (BP Location: Right Arm, Patient Position: Sitting, Cuff Size: Large)   Pulse 80   Temp 97.7  ?F (36.5 ?C) (Temporal)   Ht '5\' 9"'  (1.753 m)   Wt 206 lb (93.4 kg)   SpO2 100%   BMI 30.42 kg/m?  ? ?Wt Readings from Last 3 Encounters:  ?03/02/22 226 lb (102.5 kg)  ?02/04/21 232 lb (105.2 kg)  ?02/27/19 232 lb 1.9 oz (105.3 kg)  ? ? ?General: Appears his stated age, obese, in NAD. ?HEENT: Head: normal shape and size; Eyes: sclera white, no icterus, conjunctiva pink, PERRLA and EOMs intact; Ears: Tm's gray and intact, normal light reflex; Nose: mucosa pink and moist, septum midline;  ?Cardiovascular: Normal rate and rhythm. S1,S2 noted.  No murmur, rubs or gallops noted. No JVD or BLE edema. No carotid bruits noted. ?Pulmonary/Chest: Normal effort and positive vesicular breath sounds. No respiratory distress. No wheezes, rales or ronchi noted.  ?Musculoskeletal: No difficulty with gait.  ?Neurological: Alert and oriented. Coordination normal.  ? ? ?BMET ?   ?Component Value Date/Time  ? NA 139 02/07/2019 1205  ? NA 138 11/21/2014 1758  ? K 4.5 02/07/2019 1205  ? K 3.9 11/21/2014 1758  ? CL 105 02/07/2019 1205  ? CL 105 11/21/2014 1758  ? CO2 27 02/07/2019 1205  ? CO2 25 11/21/2014 1758  ? GLUCOSE 108 (H) 02/07/2019 1205  ? GLUCOSE 88 11/21/2014 1758  ? BUN 15 02/07/2019 1205  ? BUN 14 11/21/2014 1758  ? CREATININE 0.92 02/07/2019 1205  ? CREATININE 0.89 11/21/2014 1758  ? CALCIUM 9.5 02/07/2019 1205  ? CALCIUM 8.9 11/21/2014 1758  ? GFRNONAA >60 11/21/2014 1758  ? GFRAA >60 11/21/2014 1758  ? ? ?Lipid Panel  ?   ?Component Value Date/Time  ? CHOL 157 01/11/2017 1520  ? TRIG 68.0 01/11/2017 1520  ? HDL 63.50 01/11/2017 1520  ? CHOLHDL 2 01/11/2017 1520  ? VLDL 13.6 01/11/2017 1520  ? [place] 80 01/11/2017 1520  ? ? ?CBC ?   ?Component Value Date/Time  ? WBC 4.8 02/07/2019 1205  ? RBC 4.51 02/07/2019 1205  ? HGB 13.2 02/07/2019 1205  ? HGB 13.9 11/21/2014 1758  ? HCT 39.6 02/07/2019 1205  ? HCT 41.9 11/21/2014 1758  ? PLT 242.0 02/07/2019 1205  ? PLT 229 11/21/2014 1758  ? MCV 87.7 02/07/2019 1205  ? MCV 92  11/21/2014 1758  ? MCH 30.6 11/21/2014 1758  ? MCHC 33.4 02/07/2019 1205  ? RDW 15.6 (H) 02/07/2019 1205  ? RDW 13.1 11/21/2014 1758  ? LYMPHSABS 1.8 11/21/2014 1758  ? MONOABS 0.6 11/21/2014 1758  ? EOSABS 0.1 11/21/2014 1758  ? BASOSABS 0.0 11/21/2014 1758  ? ? ?Hgb A1C ?Lab Results  ?Component Value Date  ? HGBA1C 8.2 (A) 03/02/2022  ? ? ? ? ? ? ? ?   ?  Assessment & Plan:  ? ?Fatigue, Lightheadedness: ? ?Orthostatics negative ?BP normal without medication, will D/C Losartan HCT ?Encourage adequate water intake, change positions slowly ?We will check CBC, c-Met and lipid profile today ? ?RTC in 2 months for follow-up of DM2 ?Webb Silversmith, NP ? ? ?

## 2022-04-02 NOTE — Assessment & Plan Note (Signed)
Recent weight loss of 25 pounds ?We will D/C the Losartan HCT ?Reinforced DASH diet and exercise for weight loss ?We will continue to monitor ?

## 2022-04-02 NOTE — Patient Instructions (Signed)

## 2022-04-02 NOTE — Assessment & Plan Note (Signed)
Encourage diet and exercise for weight loss 

## 2022-04-03 LAB — LIPID PANEL
Cholesterol: 127 mg/dL (ref <200)
HDL: 38 mg/dL — ABNORMAL LOW (ref >=40)
LDL Cholesterol (Calc): 70 mg/dL (calc)
Non-HDL Cholesterol (Calc): 89 mg/dL (calc) (ref <130)
Total CHOL/HDL Ratio: 3.3 (calc) (ref <5.0)
Triglycerides: 109 mg/dL (ref <150)

## 2022-04-03 LAB — COMPLETE METABOLIC PANEL WITH GFR
AG Ratio: 1.7 (calc) (ref 1.0–2.5)
ALT: 29 U/L (ref 9–46)
AST: 25 U/L (ref 10–35)
Albumin: 3.9 g/dL (ref 3.6–5.1)
Alkaline phosphatase (APISO): 108 U/L (ref 35–144)
BUN: 15 mg/dL (ref 7–25)
CO2: 28 mmol/L (ref 20–32)
Calcium: 9.4 mg/dL (ref 8.6–10.3)
Chloride: 104 mmol/L (ref 98–110)
Creat: 0.78 mg/dL (ref 0.70–1.30)
Globulin: 2.3 g/dL (calc) (ref 1.9–3.7)
Glucose, Bld: 111 mg/dL (ref 65–139)
Potassium: 4.3 mmol/L (ref 3.5–5.3)
Sodium: 139 mmol/L (ref 135–146)
Total Bilirubin: 0.4 mg/dL (ref 0.2–1.2)
Total Protein: 6.2 g/dL (ref 6.1–8.1)
eGFR: 105 mL/min/{1.73_m2} (ref >=60)

## 2022-04-03 LAB — CBC
HCT: 41.2 % (ref 38.5–50.0)
Hemoglobin: 14 g/dL (ref 13.2–17.1)
MCH: 31.9 pg (ref 27.0–33.0)
MCHC: 34 g/dL (ref 32.0–36.0)
MCV: 93.8 fL (ref 80.0–100.0)
MPV: 9.7 fL (ref 7.5–12.5)
Platelets: 183 10*3/uL (ref 140–400)
RBC: 4.39 10*6/uL (ref 4.20–5.80)
RDW: 13.2 % (ref 11.0–15.0)
WBC: 4.1 10*3/uL (ref 3.8–10.8)

## 2022-05-07 ENCOUNTER — Encounter: Payer: Self-pay | Admitting: Internal Medicine

## 2022-05-29 ENCOUNTER — Encounter: Payer: Self-pay | Admitting: Internal Medicine

## 2022-05-29 DIAGNOSIS — K219 Gastro-esophageal reflux disease without esophagitis: Secondary | ICD-10-CM

## 2022-05-29 MED ORDER — OMEPRAZOLE 40 MG PO CPDR: 40.0000 mg | DELAYED_RELEASE_CAPSULE | Freq: Every day | ORAL | 5 refills | Status: DC

## 2022-06-03 ENCOUNTER — Other Ambulatory Visit: Payer: Self-pay

## 2022-06-03 ENCOUNTER — Ambulatory Visit (INDEPENDENT_AMBULATORY_CARE_PROVIDER_SITE_OTHER): Payer: 59 | Admitting: Internal Medicine

## 2022-06-03 ENCOUNTER — Encounter: Payer: Self-pay | Admitting: Internal Medicine

## 2022-06-03 VITALS — BP 136/84 | HR 74 | Temp 96.8°F | Wt 190.0 lb

## 2022-06-03 DIAGNOSIS — E1165 Type 2 diabetes mellitus with hyperglycemia: Secondary | ICD-10-CM

## 2022-06-03 DIAGNOSIS — Z1211 Encounter for screening for malignant neoplasm of colon: Secondary | ICD-10-CM

## 2022-06-03 DIAGNOSIS — Z85038 Personal history of other malignant neoplasm of large intestine: Secondary | ICD-10-CM

## 2022-06-03 DIAGNOSIS — I10 Essential (primary) hypertension: Secondary | ICD-10-CM

## 2022-06-03 DIAGNOSIS — E663 Overweight: Secondary | ICD-10-CM

## 2022-06-03 DIAGNOSIS — Z6828 Body mass index (BMI) 28.0-28.9, adult: Secondary | ICD-10-CM

## 2022-06-03 MED ORDER — NA SULFATE-K SULFATE-MG SULF 17.5-3.13-1.6 GM/177ML PO SOLN: 1.0000 | Freq: Once | ORAL | 0 refills | Status: AC

## 2022-06-03 NOTE — Assessment & Plan Note (Signed)
Controlled off meds ?Reinforced DASH diet ?We will monitor ?

## 2022-06-03 NOTE — Patient Instructions (Signed)

## 2022-06-03 NOTE — Progress Notes (Signed)
Subjective:    Patient ID: Alex Carr, male    DOB: [birth year], 56 y.o.   MRN: [id number]  HPI  Patient presents to clinic today for 19-monthfollow-up of HTN and DM2.  HTN: His BP today is 136/84.  He is currently not taking any antihypertensive medications.  ECG from 05/2018 reviewed.  DM2: His last A1c was 8.2%, 02/2022.  He is not taking any oral diabetic medications at this time.  His sugars range 89-130. He checks his feet routinely.  His last eye exam was > 1 year ago  Flu 10/2016.  Pneumovax never.  COVID Moderna x2.  He would also like a referral for his colonoscopy.  His last colonoscopy was in 2016, 3-year recall.  He has a history of colon cancer.  Review of Systems     Past Medical History:  Diagnosis Date   Arthritis    Chicken pox    [place]   History of kidney stones    History of stomach ulcers    Hx of colonic polyps     Current Outpatient Medications  Medication Sig Dispense Refill   naproxen (NAPROSYN) 375 MG tablet Take 1 tablet (375 mg total) by mouth 2 (two) times daily with a meal. 180 tablet 0   omeprazole (PRILOSEC) 40 MG capsule Take 1 capsule (40 mg total) by mouth daily. 30 capsule 5   pyridOXINE (VITAMIN B-6) 100 MG tablet Take 100 mg by mouth 2 (two) times daily.     tadalafil (CIALIS) 20 MG tablet Take 0.5-1 tablets (10-20 mg total) by mouth every other day as needed for erectile dysfunction. 30 tablet 1   Zinc 25 MG TABS Take by mouth.     No current facility-administered medications for this visit.    Allergies  Allergen Reactions   Fluconazole Rash    Family History  Problem Relation Age of Onset   Cancer Father        Cancerous Polyps   Heart disease Father    Hyperlipidemia Father    Cancer Paternal Grandfather        Lung    Social History   Socioeconomic History   Marital status: Married    Spouse name: Not on file   Number of children: Not on file   Years of education: Not on file   Highest  education level: Not on file  Occupational History   Not on file  Tobacco Use   Smoking status: Former    Types: Cigarettes    Quit date: 11/29/1998    Years since quitting: 23.5   Smokeless tobacco: Never   Tobacco comments:    Quit in 1999  Vaping Use   Vaping Use: Never used  Substance and Sexual Activity   Alcohol use: No   Drug use: No   Sexual activity: Yes  Other Topics Concern   Not on file  Social History Narrative   Not on file   Social Determinants of Health   Financial Resource Strain: Not on file  Food Insecurity: Not on file  Transportation Needs: Not on file  Physical Activity: Not on file  Stress: Not on file  Social Connections: Not on file  Intimate Partner Violence: Not on file     Constitutional: Denies fever, malaise, fatigue, headache or abrupt weight changes.  HEENT: Denies eye pain, eye redness, ear pain, ringing in the ears, wax buildup, runny nose, nasal congestion, bloody nose, or sore throat. Respiratory: Denies difficulty breathing, shortness of  breath, cough or sputum production.   Cardiovascular: Denies chest pain, chest tightness, palpitations or swelling in the hands or feet.  Gastrointestinal: Denies abdominal pain, bloating, constipation, diarrhea or blood in the stool.  GU: Denies urgency, frequency, pain with urination, burning sensation, blood in urine, odor or discharge. Musculoskeletal: Denies decrease in range of motion, difficulty with gait, muscle pain or joint pain and swelling.  Skin: Denies redness, rashes, lesions or ulcercations.  Neurological: Patient reports intermittent lightheadedness.  Denies difficulty with memory, difficulty with speech or problems with balance and coordination.  Psych: Denies anxiety, depression, SI/HI.  No other specific complaints in a complete review of systems (except as listed in HPI above).  Objective:   Physical Exam BP 136/84 (BP Location: Right Arm, Patient Position: Sitting, Cuff Size:  Normal)   Pulse 74   Temp (!) 96.8 F (36 C) (Temporal)   Wt 190 lb (86.2 kg)   SpO2 100%   BMI 28.06 kg/m   Wt Readings from Last 3 Encounters:  04/02/22 206 lb (93.4 kg)  03/02/22 226 lb (102.5 kg)  02/04/21 232 lb (105.2 kg)    General: Appears his stated age, overweight, in NAD. Skin: Warm, dry and intact. No ulcerations noted. HEENT: Head: normal shape and size; Eyes: sclera white, no icterus, conjunctiva pink, PERRLA and EOMs intact;  Cardiovascular: Normal rate and rhythm. S1,S2 noted.  No murmur, rubs or gallops noted. No JVD or BLE edema. No carotid bruits noted. Pulmonary/Chest: Normal effort and positive vesicular breath sounds. No respiratory distress. No wheezes, rales or ronchi noted.  Musculoskeletal: No difficulty with gait.  Neurological: Alert and oriented. Coordination normal.    BMET    Component Value Date/Time   NA 139 04/02/2022 0844   NA 138 11/21/2014 1758   K 4.3 04/02/2022 0844   K 3.9 11/21/2014 1758   CL 104 04/02/2022 0844   CL 105 11/21/2014 1758   CO2 28 04/02/2022 0844   CO2 25 11/21/2014 1758   GLUCOSE 111 04/02/2022 0844   GLUCOSE 88 11/21/2014 1758   BUN 15 04/02/2022 0844   BUN 14 11/21/2014 1758   CREATININE 0.78 04/02/2022 0844   CALCIUM 9.4 04/02/2022 0844   CALCIUM 8.9 11/21/2014 1758   GFRNONAA >60 11/21/2014 1758   GFRAA >60 11/21/2014 1758    Lipid Panel     Component Value Date/Time   CHOL 127 04/02/2022 0844   TRIG 109 04/02/2022 0844   HDL 38 (L) 04/02/2022 0844   CHOLHDL 3.3 04/02/2022 0844   VLDL 13.6 01/11/2017 1520   LDLCALC 70 04/02/2022 0844    CBC    Component Value Date/Time   WBC 4.1 04/02/2022 0844   RBC 4.39 04/02/2022 0844   HGB 14.0 04/02/2022 0844   HGB 13.9 11/21/2014 1758   HCT 41.2 04/02/2022 0844   HCT 41.9 11/21/2014 1758   PLT 183 04/02/2022 0844   PLT 229 11/21/2014 1758   MCV 93.8 04/02/2022 0844   MCV 92 11/21/2014 1758   MCH 31.9 04/02/2022 0844   MCHC 34.0 04/02/2022 0844    RDW 13.2 04/02/2022 0844   RDW 13.1 11/21/2014 1758   LYMPHSABS 1.8 11/21/2014 1758   MONOABS 0.6 11/21/2014 1758   EOSABS 0.1 11/21/2014 1758   BASOSABS 0.0 11/21/2014 1758    Hgb A1C Lab Results  Component Value Date   HGBA1C 8.2 (A) 03/02/2022            Assessment & Plan:   Screen for Colon Cancer, History  of Colon Cancer:  Referral to GI for colonoscopy  Webb Silversmith, NP

## 2022-06-03 NOTE — Progress Notes (Signed)
Gastroenterology Pre-Procedure  Request Date: 07/06/2022 Requesting Physician: Dr. Vicente Males  PATIENT REVIEW QUESTIONS: The patient responded to the following health history questions as indicated:    1. Are you having any GI issues? no 2. Do you have a personal history of Polyps? yes (colon cancer 2020) 3. Do you have a family history of Colon Cancer or Polyps? no 4. Diabetes Mellitus? no 5. Joint replacements in the past 12 months?no 6. Major health problems in the past 3 months?no 7. Any artificial heart valves, MVP, or defibrillator?no    MEDICATIONS & ALLERGIES:    Patient reports the following regarding taking any anticoagulation/antiplatelet therapy:   Plavix, Coumadin, Eliquis, Xarelto, Lovenox, Pradaxa, Brilinta, or Effient? no Aspirin? no  Patient confirms/reports the following medications:  Current Outpatient Medications  Medication Sig Dispense Refill   naproxen (NAPROSYN) 375 MG tablet Take 1 tablet (375 mg total) by mouth 2 (two) times daily with a meal. 180 tablet 0   omeprazole (PRILOSEC) 40 MG capsule Take 1 capsule (40 mg total) by mouth daily. 30 capsule 5   pyridOXINE (VITAMIN B-6) 100 MG tablet Take 100 mg by mouth 2 (two) times daily.     tadalafil (CIALIS) 20 MG tablet Take 0.5-1 tablets (10-20 mg total) by mouth every other day as needed for erectile dysfunction. 30 tablet 1   Zinc 25 MG TABS Take by mouth.     No current facility-administered medications for this visit.    Patient confirms/reports the following allergies:  Allergies  Allergen Reactions   Fluconazole Rash    No orders of the defined types were placed in this encounter.   AUTHORIZATION INFORMATION Primary Insurance: 1D#: Group #:  Secondary Insurance: 1D#: Group #:  SCHEDULE INFORMATION: Date: 07/06/2022 Time: Location:[facility]

## 2022-06-03 NOTE — Assessment & Plan Note (Signed)
Encourage diet and exercise weight loss 

## 2022-06-03 NOTE — Assessment & Plan Note (Signed)
POCT A1c 5.1% We will check urine microalbumin today Encourage low-carb diet and exercise for weight loss Encouraged him to schedule an appointment for diabetic eye exam Encourage routine foot exam Encouraged him to get a flu shot in the fall He declines Pneumovax and COVID vaccines

## 2022-06-04 LAB — MICROALBUMIN / CREATININE URINE RATIO
Creatinine, Urine: 48.9 mg/dL
Microalb/Creat Ratio: 46 mg/g creat — ABNORMAL HIGH (ref 0–29)
Microalbumin, Urine: 22.7 ug/mL

## 2022-06-11 ENCOUNTER — Encounter: Payer: Self-pay | Admitting: Internal Medicine

## 2022-07-03 ENCOUNTER — Telehealth: Payer: Self-pay

## 2022-07-03 NOTE — Telephone Encounter (Signed)
CALLED PATIENT NO ANSWER LEFT VOICEMAIL FOR A CALL BACK ? ?

## 2022-07-06 ENCOUNTER — Ambulatory Visit: Payer: 59 | Admitting: Certified Registered Nurse Anesthetist

## 2022-07-06 ENCOUNTER — Encounter
Admission: RE | Disposition: A | Discharge status: Home / Self Care | Payer: Self-pay | Source: Home / Self Care | Attending: Gastroenterology

## 2022-07-06 ENCOUNTER — Other Ambulatory Visit: Payer: Self-pay

## 2022-07-06 ENCOUNTER — Encounter: Payer: Self-pay | Admitting: Gastroenterology

## 2022-07-06 ENCOUNTER — Telehealth: Payer: Self-pay

## 2022-07-06 ENCOUNTER — Ambulatory Visit
Admission: RE | Admit: 2022-07-06 | Discharge: 2022-07-06 | Disposition: A | Discharge status: Home / Self Care | Payer: 59 | Attending: Gastroenterology | Admitting: Gastroenterology

## 2022-07-06 DIAGNOSIS — Z87891 Personal history of nicotine dependence: Secondary | ICD-10-CM | POA: Diagnosis not present

## 2022-07-06 DIAGNOSIS — Z1211 Encounter for screening for malignant neoplasm of colon: Secondary | ICD-10-CM | POA: Diagnosis present

## 2022-07-06 DIAGNOSIS — Z8711 Personal history of peptic ulcer disease: Secondary | ICD-10-CM | POA: Diagnosis not present

## 2022-07-06 DIAGNOSIS — Z85038 Personal history of other malignant neoplasm of large intestine: Secondary | ICD-10-CM | POA: Insufficient documentation

## 2022-07-06 DIAGNOSIS — Z8719 Personal history of other diseases of the digestive system: Secondary | ICD-10-CM | POA: Diagnosis not present

## 2022-07-06 DIAGNOSIS — M1990 Unspecified osteoarthritis, unspecified site: Secondary | ICD-10-CM | POA: Diagnosis not present

## 2022-07-06 DIAGNOSIS — K219 Gastro-esophageal reflux disease without esophagitis: Secondary | ICD-10-CM | POA: Insufficient documentation

## 2022-07-06 HISTORY — PX: COLONOSCOPY WITH PROPOFOL: SHX5780

## 2022-07-06 SURGERY — COLONOSCOPY WITH PROPOFOL
Anesthesia: General

## 2022-07-06 MED ORDER — PROPOFOL 500 MG/50ML IV EMUL
INTRAVENOUS | Status: DC | PRN
  Administered 2022-07-06: 160 ug/kg/min via INTRAVENOUS

## 2022-07-06 MED ORDER — PROPOFOL 10 MG/ML IV BOLUS
INTRAVENOUS | Status: DC | PRN
  Administered 2022-07-06: 70 mg via INTRAVENOUS

## 2022-07-06 MED ORDER — NA SULFATE-K SULFATE-MG SULF 17.5-3.13-1.6 GM/177ML PO SOLN: 1.0000 | Freq: Once | ORAL | 0 refills | Status: AC

## 2022-07-06 MED ORDER — LIDOCAINE HCL (CARDIAC) PF 100 MG/5ML IV SOSY
PREFILLED_SYRINGE | INTRAVENOUS | Status: DC | PRN
  Administered 2022-07-06: 50 mg via INTRAVENOUS

## 2022-07-06 MED ORDER — SODIUM CHLORIDE 0.9 % IV SOLN
INTRAVENOUS | Status: DC
  Administered 2022-07-06: 1000 mL via INTRAVENOUS

## 2022-07-06 NOTE — Anesthesia Postprocedure Evaluation (Signed)
Anesthesia Post Note  Patient: Alex Carr  Procedure(s) Performed: COLONOSCOPY WITH PROPOFOL  Patient location during evaluation: Endoscopy Anesthesia Type: General Level of consciousness: awake and alert Pain management: pain level controlled Vital Signs Assessment: post-procedure vital signs reviewed and stable Respiratory status: spontaneous breathing, nonlabored ventilation, respiratory function stable and patient connected to nasal cannula oxygen Cardiovascular status: blood pressure returned to baseline and stable Postop Assessment: no apparent nausea or vomiting Anesthetic complications: no   No notable events documented.   Last Vitals:  Vitals:   07/06/22 0940 07/06/22 0950  BP: 117/77 117/87  Pulse:    Resp: 10 17  Temp:    SpO2:      Last Pain:  Vitals:   07/06/22 0834  TempSrc: Temporal  PainSc: 0-No pain                 Dimas Millin

## 2022-07-06 NOTE — H&P (Signed)
Jonathon Bellows, MD [address] [place], [place], [place], [address] [id number], [place], [place], [place], [address] Phone: [phone]  Fax: [phone]  Primary Care Physician:  Jearld Fenton, NP   Pre-Procedure History & Physical: HPI:  Alex Carr is a 56 y.o. male is here for an colonoscopy.   Past Medical History:  Diagnosis Date   Arthritis    Chicken pox    Colon cancer ([place] [facility]) 2000   History of kidney stones    History of stomach ulcers    Hx of colonic polyps     Past Surgical History:  Procedure Laterality Date   COLON SURGERY  2000   COLOSTOMY/ COLOSTOMY REVERSAL   FIBULA FRACTURE SURGERY     repair   FRACTURE SURGERY     SHOULDER ARTHROSCOPY WITH OPEN ROTATOR CUFF REPAIR Right 11/30/2018   Procedure: SHOULDER ARTHROSCOPY WITH ROTATOR CUFF REPAIR;  Surgeon: Lovell Sheehan, MD;  Location: [facility] ORS;  Service: Orthopedics;  Laterality: Right;   TONSILLECTOMY      Prior to Admission medications   Medication Sig Start Date End Date Taking? Authorizing Provider  naproxen (NAPROSYN) 375 MG tablet Take 1 tablet (375 mg total) by mouth 2 (two) times daily with a meal. 03/02/22  Yes Baity, Coralie Keens, NP  omeprazole (PRILOSEC) 40 MG capsule Take 1 capsule (40 mg total) by mouth daily. 05/29/22  Yes Jearld Fenton, NP  pyridOXINE (VITAMIN B-6) 100 MG tablet Take 100 mg by mouth 2 (two) times daily.   Yes [provider]  Zinc 25 MG TABS Take by mouth.   Yes [provider]  tadalafil (CIALIS) 20 MG tablet Take 0.5-1 tablets (10-20 mg total) by mouth every other day as needed for erectile dysfunction. 03/02/22   Jearld Fenton, NP    Allergies as of 06/04/2022 - Review Complete 06/03/2022  Allergen Reaction Noted   Fluconazole Rash 10/02/2014    Family History  Problem Relation Age of Onset   Cancer Father        Cancerous Polyps   Heart disease Father    Hyperlipidemia Father    Cancer Paternal Grandfather        Lung    Social  History   Socioeconomic History   Marital status: Married    Spouse name: Not on file   Number of children: Not on file   Years of education: Not on file   Highest education level: Not on file  Occupational History   Not on file  Tobacco Use   Smoking status: Former    Types: Cigarettes    Quit date: 11/29/1998    Years since quitting: 23.6   Smokeless tobacco: Never   Tobacco comments:    Quit in 1999  Vaping Use   Vaping Use: Never used  Substance and Sexual Activity   Alcohol use: No   Drug use: No   Sexual activity: Yes  Other Topics Concern   Not on file  Social History Narrative   Not on file   Social Determinants of Health   Financial Resource Strain: Not on file  Food Insecurity: Not on file  Transportation Needs: Not on file  Physical Activity: Not on file  Stress: Not on file  Social Connections: Not on file  Intimate Partner Violence: Not on file    Review of Systems: See HPI, otherwise negative ROS  Physical Exam: BP (!) 133/96   Pulse 71   Temp (!) 96.5 F (35.8 C) (Temporal)  Resp 18   Ht '5\' 8"'$  (1.727 m)   Wt 81.8 kg   BMI 27.42 kg/m  General:   Alert,  pleasant and cooperative in NAD Head:  Normocephalic and atraumatic. Neck:  Supple; no masses or thyromegaly. Lungs:  Clear throughout to auscultation, normal respiratory effort.    Heart:  +S1, +S2, Regular rate and rhythm, No edema. Abdomen:  Soft, nontender and nondistended. Normal bowel sounds, without guarding, and without rebound.   Neurologic:  Alert and  oriented x4;  grossly normal neurologically.  Impression/Plan: Brondon Wann is here for an colonoscopy to be performed for surveillance due to prior history of colon cancer  Risks, benefits, limitations, and alternatives regarding  colonoscopy have been reviewed with the patient.  Questions have been answered.  All parties agreeable.   Jonathon Bellows, MD  07/06/2022, 8:54 AM

## 2022-07-06 NOTE — Anesthesia Procedure Notes (Signed)
Procedure Name: MAC Date/Time: 07/06/2022 8:54 AM  Performed by: Tollie Eth, CRNAPre-anesthesia Checklist: Patient identified, Emergency Drugs available, Suction available and Patient being monitored Patient Re-evaluated:Patient Re-evaluated prior to induction Oxygen Delivery Method: Nasal cannula Induction Type: IV induction Placement Confirmation: positive ETCO2

## 2022-07-06 NOTE — Anesthesia Preprocedure Evaluation (Signed)
Anesthesia Evaluation  Patient identified by MRN, date of birth, ID band Patient awake    Reviewed: Allergy & Precautions, NPO status , Patient's Chart, lab work & pertinent test results  Airway Mallampati: II  TM Distance: >3 FB Neck ROM: full    Dental no notable dental hx. (+) Teeth Intact   Pulmonary neg pulmonary ROS, former smoker,    Pulmonary exam normal        Cardiovascular negative cardio ROS Normal cardiovascular exam     Neuro/Psych negative neurological ROS  negative psych ROS   GI/Hepatic Neg liver ROS, GERD  ,  Endo/Other  negative endocrine ROS  Renal/GU negative Renal ROS  negative genitourinary   Musculoskeletal   Abdominal   Peds  Hematology negative hematology ROS (+)   Anesthesia Other Findings Past Medical History: No date: Arthritis No date: Chicken pox 2000: Colon cancer ([facility]) No date: History of kidney stones No date: History of stomach ulcers No date: Hx of colonic polyps  Past Surgical History: 2000: COLON SURGERY     Comment:  COLOSTOMY/ COLOSTOMY REVERSAL No date: FIBULA FRACTURE SURGERY     Comment:  repair No date: FRACTURE SURGERY 11/30/2018: SHOULDER ARTHROSCOPY WITH OPEN ROTATOR CUFF REPAIR; Right     Comment:  Procedure: SHOULDER ARTHROSCOPY WITH ROTATOR CUFF               REPAIR;  Surgeon: Lovell Sheehan, MD;  Location: [facility]               ORS;  Service: Orthopedics;  Laterality: Right; No date: TONSILLECTOMY  BMI    Body Mass Index: 27.42 kg/m      Reproductive/Obstetrics negative OB ROS                            Anesthesia Physical Anesthesia Plan  ASA: 2  Anesthesia Plan: General   Post-op Pain Management: Minimal or no pain anticipated   Induction: Intravenous  PONV Risk Score and Plan: 3 and Propofol infusion, TIVA and Ondansetron  Airway Management Planned: Nasal Cannula  Additional Equipment: None  Intra-op Plan:    Post-operative Plan:   Informed Consent: I have reviewed the patients History and Physical, chart, labs and discussed the procedure including the risks, benefits and alternatives for the proposed anesthesia with the patient or authorized representative who has indicated his/her understanding and acceptance.     Dental advisory given  Plan Discussed with: CRNA and Surgeon  Anesthesia Plan Comments: (Discussed risks of anesthesia with patient, including possibility of difficulty with spontaneous ventilation under anesthesia necessitating airway intervention, PONV, and rare risks such as cardiac or respiratory or neurological events, and allergic reactions. Discussed the role of CRNA in patient's perioperative care. Patient understands.)        Anesthesia Quick Evaluation

## 2022-07-06 NOTE — Op Note (Signed)
[facility] Gastroenterology Patient Name: Alex Carr Procedure Date: 07/06/2022 8:53 AM MRN: [id number] Account #: [id number] Date of Birth: Mar 22, 1966 Admit Type: Outpatient Age: 56 Room: [facility] ENDO ROOM 1 Gender: Male Note Status: Finalized Instrument Name: Jasper Riling [id number] Procedure:             Colonoscopy Indications:           High risk colon cancer surveillance: Personal history                         of colon cancer Providers:             Jonathon Bellows MD, MD Referring MD:          Jearld Fenton (Referring MD) Medicines:             Monitored Anesthesia Care Complications:         No immediate complications. Procedure:             Pre-Anesthesia Assessment:                        - Prior to the procedure, a History and Physical was                         performed, and patient medications, allergies and                         sensitivities were reviewed. The patient's tolerance                         of previous anesthesia was reviewed.                        - The risks and benefits of the procedure and the                         sedation options and risks were discussed with the                         patient. All questions were answered and informed                         consent was obtained.                        - ASA Grade Assessment: II - A patient with mild                         systemic disease.                        After obtaining informed consent, the colonoscope was                         passed under direct vision. Throughout the procedure,                         the patient's blood pressure, pulse, and oxygen                         saturations were monitored  continuously. The                         Colonoscope was introduced through the anus and                         advanced to the the cecum, identified by the                         appendiceal orifice. The colonoscopy was performed                         without  difficulty. The patient tolerated the                         procedure well. The quality of the bowel preparation                         was unsatisfactory. Findings:      The perianal and digital rectal examinations were normal.      A large amount of semi-liquid stool was found in the entire colon,       interfering with visualization. Impression:            - Preparation of the colon was unsatisfactory.                        - Stool in the entire examined colon.                        - No specimens collected. Recommendation:        - Discharge patient to home (with escort).                        - Resume previous diet.                        - Continue present medications.                        - Repeat colonoscopy tomorrow because the bowel                         preparation was suboptimal and for surveillance. Procedure Code(s):     --- Professional ---                        [phone], Colonoscopy, flexible; diagnostic, including                         collection of specimen(s) by brushing or washing, when                         performed (separate procedure) Diagnosis Code(s):     --- Professional ---                        H82.993, Personal history of other malignant neoplasm                         of large intestine CPT copyright 2019 American Medical Association. All rights reserved.  The codes documented in this report are preliminary and upon coder review may  be revised to meet current compliance requirements. Jonathon Bellows, MD Jonathon Bellows MD, MD 07/06/2022 9:16:45 AM This report has been signed electronically. Number of Addenda: 0 Note Initiated On: 07/06/2022 8:53 AM Scope Withdrawal Time: 0 hours 6 minutes 58 seconds  Total Procedure Duration: 0 hours 12 minutes 34 seconds  Estimated Blood Loss:  Estimated blood loss: none.      [facility]

## 2022-07-06 NOTE — Telephone Encounter (Signed)
Patient has been informed that Dr. Vicente Males has asked me to send in additional bowel prep to pharmacy.  Suprep has been sent to [facility] on [address].  Thanks, [place], [place]

## 2022-07-06 NOTE — Transfer of Care (Signed)
Immediate Anesthesia Transfer of Care Note  Patient: Alex Carr  Procedure(s) Performed: COLONOSCOPY WITH PROPOFOL  Patient Location: Endoscopy Unit  Anesthesia Type:General  Level of Consciousness: drowsy  Airway & Oxygen Therapy: Patient Spontanous Breathing  Post-op Assessment: Report given to RN and Post -op Vital signs reviewed and stable  Post vital signs: Reviewed and stable  Last Vitals:  Vitals Value Taken Time  BP    Temp    Pulse 70 07/06/22 0920  Resp 14 07/06/22 0920  SpO2 100 % 07/06/22 0920    Last Pain:  Vitals:   07/06/22 0834  TempSrc: Temporal  PainSc: 0-No pain         Complications: No notable events documented.

## 2022-07-07 ENCOUNTER — Encounter: Payer: Self-pay | Admitting: Gastroenterology

## 2022-07-07 ENCOUNTER — Ambulatory Visit: Payer: 59 | Admitting: Anesthesiology

## 2022-07-07 ENCOUNTER — Encounter
Admission: RE | Disposition: A | Discharge status: Home / Self Care | Payer: Self-pay | Source: Ambulatory Visit | Attending: Gastroenterology

## 2022-07-07 ENCOUNTER — Other Ambulatory Visit: Payer: Self-pay

## 2022-07-07 ENCOUNTER — Ambulatory Visit
Admission: RE | Admit: 2022-07-07 | Discharge: 2022-07-07 | Disposition: A | Discharge status: Home / Self Care | Payer: 59 | Source: Ambulatory Visit | Attending: Gastroenterology | Admitting: Gastroenterology

## 2022-07-07 DIAGNOSIS — Z87891 Personal history of nicotine dependence: Secondary | ICD-10-CM | POA: Insufficient documentation

## 2022-07-07 DIAGNOSIS — Z85038 Personal history of other malignant neoplasm of large intestine: Secondary | ICD-10-CM | POA: Diagnosis not present

## 2022-07-07 DIAGNOSIS — K219 Gastro-esophageal reflux disease without esophagitis: Secondary | ICD-10-CM | POA: Insufficient documentation

## 2022-07-07 DIAGNOSIS — Z1211 Encounter for screening for malignant neoplasm of colon: Secondary | ICD-10-CM | POA: Insufficient documentation

## 2022-07-07 DIAGNOSIS — D122 Benign neoplasm of ascending colon: Secondary | ICD-10-CM | POA: Diagnosis not present

## 2022-07-07 HISTORY — PX: COLONOSCOPY: SHX5424

## 2022-07-07 SURGERY — COLONOSCOPY
Anesthesia: General

## 2022-07-07 MED ORDER — PROPOFOL 500 MG/50ML IV EMUL
INTRAVENOUS | Status: DC | PRN
  Administered 2022-07-07: 165 ug/kg/min via INTRAVENOUS

## 2022-07-07 MED ORDER — LIDOCAINE HCL (CARDIAC) PF 100 MG/5ML IV SOSY
PREFILLED_SYRINGE | INTRAVENOUS | Status: DC | PRN
  Administered 2022-07-07: 100 mg via INTRAVENOUS

## 2022-07-07 MED ORDER — SODIUM CHLORIDE 0.9 % IV SOLN: INTRAVENOUS | Status: DC

## 2022-07-07 MED ORDER — PROPOFOL 10 MG/ML IV BOLUS
INTRAVENOUS | Status: DC | PRN
  Administered 2022-07-07: 50 mg via INTRAVENOUS

## 2022-07-07 NOTE — Op Note (Signed)
[facility] Gastroenterology Patient Name: Alex Carr Procedure Date: 07/07/2022 10:10 AM MRN: [id number] Account #: [id number] Date of Birth: [birth year] Admit Type: Outpatient Age: 56 Room: [facility] ENDO ROOM 2 Gender: Male Note Status: Finalized Instrument Name: Jasper Riling [id number] Procedure:             Colonoscopy Indications:           High risk colon cancer surveillance: Personal history                         of colon cancer Providers:             Jonathon Bellows MD, MD Medicines:             Monitored Anesthesia Care Complications:         No immediate complications. Procedure:             Pre-Anesthesia Assessment:                        - Prior to the procedure, a History and Physical was                         performed, and patient medications, allergies and                         sensitivities were reviewed. The patient's tolerance                         of previous anesthesia was reviewed.                        - The risks and benefits of the procedure and the                         sedation options and risks were discussed with the                         patient. All questions were answered and informed                         consent was obtained.                        - ASA Grade Assessment: II - A patient with mild                         systemic disease.                        After obtaining informed consent, the colonoscope was                         passed under direct vision. Throughout the procedure,                         the patient's blood pressure, pulse, and oxygen                         saturations were monitored continuously. The  Colonoscope was introduced through the anus and                         advanced to the the cecum, identified by the                         appendiceal orifice, IC valve and transillumination.                         The colonoscopy was performed with ease. The patient                          tolerated the procedure well. The quality of the bowel                         preparation was adequate. Findings:      The perianal and digital rectal examinations were normal.      A 3 mm polyp was found in the ascending colon. The polyp was sessile.       The polyp was removed with a jumbo cold forceps. Resection and retrieval       were complete.      A 7 mm polyp was found in the ascending colon. The polyp was sessile.       The polyp was removed with a cold snare. Resection was complete, but the       polyp tissue was not retrieved.      The exam was otherwise without abnormality on direct and retroflexion       views. Impression:            - One 3 mm polyp in the ascending colon, removed with                         a jumbo cold forceps. Resected and retrieved.                        - One 7 mm polyp in the ascending colon, removed with                         a cold snare. Complete resection. Polyp tissue not                         retrieved.                        - The examination was otherwise normal on direct and                         retroflexion views. Recommendation:        - Discharge patient to home (with escort).                        - Resume previous diet.                        - Continue present medications.                        - Await pathology results.                        -  Repeat colonoscopy in 5 years for surveillance. Procedure Code(s):     --- Professional ---                        [id number], Colonoscopy, flexible; with removal of                         tumor(s), polyp(s), or other lesion(s) by snare                         technique                        [id number], 47, Colonoscopy, flexible; with biopsy, single                         or multiple Diagnosis Code(s):     --- Professional ---                        X09.407, Personal history of other malignant neoplasm                         of large intestine                         K63.5, Polyp of colon CPT copyright 2019 American [employer] Association. All rights reserved. The codes documented in this report are preliminary and upon coder review may  be revised to meet current compliance requirements. Jonathon Bellows, MD Jonathon Bellows MD, MD 07/07/2022 10:40:45 AM This report has been signed electronically. Number of Addenda: 0 Note Initiated On: 07/07/2022 10:10 AM Scope Withdrawal Time: 0 hours 17 minutes 43 seconds  Total Procedure Duration: 0 hours 20 minutes 17 seconds  Estimated Blood Loss:  Estimated blood loss: none.      [facility]

## 2022-07-07 NOTE — Anesthesia Procedure Notes (Signed)
Procedure Name: General with mask airway Date/Time: 07/07/2022 10:21 AM  Performed by: Kelton Pillar, CRNAPre-anesthesia Checklist: Patient identified, Emergency Drugs available, Suction available and Patient being monitored Patient Re-evaluated:Patient Re-evaluated prior to induction Oxygen Delivery Method: Simple face mask Induction Type: IV induction Placement Confirmation: positive ETCO2, CO2 detector and breath sounds checked- equal and bilateral Dental Injury: Teeth and Oropharynx as per pre-operative assessment

## 2022-07-07 NOTE — Anesthesia Preprocedure Evaluation (Addendum)
Anesthesia Evaluation  Patient identified by MRN, date of birth, ID band Patient awake    Reviewed: Allergy & Precautions, NPO status , Patient's Chart, lab work & pertinent test results  Airway Mallampati: II  TM Distance: >3 FB Neck ROM: full    Dental no notable dental hx. (+) Teeth Intact   Pulmonary neg pulmonary ROS, former smoker,    Pulmonary exam normal        Cardiovascular negative cardio ROS Normal cardiovascular exam     Neuro/Psych negative neurological ROS  negative psych ROS   GI/Hepatic Neg liver ROS, GERD  Controlled,  Endo/Other  negative endocrine ROS  Renal/GU negative Renal ROS  negative genitourinary   Musculoskeletal   Abdominal Normal abdominal exam  (+)   Peds  Hematology negative hematology ROS (+)   Anesthesia Other Findings Reports dizziness today with no other symptoms. Pt had colonoscopy yesterday. Pt is HDS.   Past Medical History: No date: Arthritis No date: Chicken pox 2000: Colon cancer ([place]) No date: History of kidney stones No date: History of stomach ulcers No date: Hx of colonic polyps  Past Surgical History: 2000: COLON SURGERY     Comment:  COLOSTOMY/ COLOSTOMY REVERSAL No date: FIBULA FRACTURE SURGERY     Comment:  repair No date: FRACTURE SURGERY 11/30/2018: SHOULDER ARTHROSCOPY WITH OPEN ROTATOR CUFF REPAIR; Right     Comment:  Procedure: SHOULDER ARTHROSCOPY WITH ROTATOR CUFF               REPAIR;  Surgeon: Lovell Sheehan, MD;  Location: [facility]               ORS;  Service: Orthopedics;  Laterality: Right; No date: TONSILLECTOMY  BMI    Body Mass Index: 27.42 kg/m      Reproductive/Obstetrics negative OB ROS                            Anesthesia Physical  Anesthesia Plan  ASA: 2  Anesthesia Plan: General   Post-op Pain Management: Minimal or no pain anticipated   Induction: Intravenous  PONV Risk Score and Plan: 3 and  Propofol infusion, TIVA and Ondansetron  Airway Management Planned: Nasal Cannula  Additional Equipment: None  Intra-op Plan:   Post-operative Plan:   Informed Consent: I have reviewed the patients History and Physical, chart, labs and discussed the procedure including the risks, benefits and alternatives for the proposed anesthesia with the patient or authorized representative who has indicated his/her understanding and acceptance.     Dental advisory given  Plan Discussed with: CRNA and Surgeon  Anesthesia Plan Comments: (Discussed risks of anesthesia with patient, including possibility of difficulty with spontaneous ventilation under anesthesia necessitating airway intervention, PONV, and rare risks such as cardiac or respiratory or neurological events, and allergic reactions. Discussed the role of CRNA in patient's perioperative care. Patient understands.)       Anesthesia Quick Evaluation

## 2022-07-07 NOTE — H&P (Signed)
Jonathon Bellows, MD [address] [place], [place], [place], [address] [address], [place], [place], [place], [address] Phone: [phone]  Fax: [phone]  Primary Care Physician:  Jearld Fenton, NP   Pre-Procedure History & Physical: HPI:  Alex Carr is a 56 y.o. male is here for an colonoscopy.   Past Medical History:  Diagnosis Date   Arthritis    Chicken pox    Colon cancer ([place]) 2000   History of kidney stones    History of stomach ulcers    Hx of colonic polyps     Past Surgical History:  Procedure Laterality Date   COLON SURGERY  2000   COLOSTOMY/ COLOSTOMY REVERSAL   COLONOSCOPY     FIBULA FRACTURE SURGERY     repair   FRACTURE SURGERY     SHOULDER ARTHROSCOPY WITH OPEN ROTATOR CUFF REPAIR Right 11/30/2018   Procedure: SHOULDER ARTHROSCOPY WITH ROTATOR CUFF REPAIR;  Surgeon: Lovell Sheehan, MD;  Location: [facility] ORS;  Service: Orthopedics;  Laterality: Right;   TONSILLECTOMY      Prior to Admission medications   Medication Sig Start Date End Date Taking? Authorizing Provider  naproxen (NAPROSYN) 375 MG tablet Take 1 tablet (375 mg total) by mouth 2 (two) times daily with a meal. 03/02/22  Yes Baity, Coralie Keens, NP  omeprazole (PRILOSEC) 40 MG capsule Take 1 capsule (40 mg total) by mouth daily. 05/29/22  Yes Jearld Fenton, NP  pyridOXINE (VITAMIN B-6) 100 MG tablet Take 100 mg by mouth 2 (two) times daily.   Yes [provider]  tadalafil (CIALIS) 20 MG tablet Take 0.5-1 tablets (10-20 mg total) by mouth every other day as needed for erectile dysfunction. 03/02/22  Yes Jearld Fenton, NP  Zinc 25 MG TABS Take by mouth.   Yes [provider]    Allergies as of 07/06/2022 - Review Complete 07/06/2022  Allergen Reaction Noted   Fluconazole Rash 10/02/2014    Family History  Problem Relation Age of Onset   Cancer Father        Cancerous Polyps   Heart disease Father    Hyperlipidemia Father    Cancer Paternal Grandfather         Lung    Social History   Socioeconomic History   Marital status: Married    Spouse name: Not on file   Number of children: Not on file   Years of education: Not on file   Highest education level: Not on file  Occupational History   Not on file  Tobacco Use   Smoking status: Former    Types: Cigarettes    Quit date: 11/29/1998    Years since quitting: 23.6   Smokeless tobacco: Never   Tobacco comments:    Quit in 1999  Vaping Use   Vaping Use: Never used  Substance and Sexual Activity   Alcohol use: No   Drug use: No   Sexual activity: Yes  Other Topics Concern   Not on file  Social History Narrative   Not on file   Social Determinants of Health   Financial Resource Strain: Not on file  Food Insecurity: Not on file  Transportation Needs: Not on file  Physical Activity: Not on file  Stress: Not on file  Social Connections: Not on file  Intimate Partner Violence: Not on file    Review of Systems: See HPI, otherwise negative ROS  Physical Exam: BP (!) 133/96   Pulse 76  Temp 97.8 F (36.6 C) (Temporal)   Resp 18   SpO2 100%  General:   Alert,  pleasant and cooperative in NAD Head:  Normocephalic and atraumatic. Neck:  Supple; no masses or thyromegaly. Lungs:  Clear throughout to auscultation, normal respiratory effort.    Heart:  +S1, +S2, Regular rate and rhythm, No edema. Abdomen:  Soft, nontender and nondistended. Normal bowel sounds, without guarding, and without rebound.   Neurologic:  Alert and  oriented x4;  grossly normal neurologically.  Impression/Plan: Alex Carr is here for an colonoscopy to be performed for history of colon cancer Risks, benefits, limitations, and alternatives regarding  colonoscopy have been reviewed with the patient.  Questions have been answered.  All parties agreeable.   Jonathon Bellows, MD  07/07/2022, 10:08 AM

## 2022-07-07 NOTE — Transfer of Care (Signed)
Immediate Anesthesia Transfer of Care Note  Patient: Alex Carr  Procedure(s) Performed: COLONOSCOPY  Patient Location: Endoscopy Unit  Anesthesia Type:General  Level of Consciousness: drowsy and patient cooperative  Airway & Oxygen Therapy: Patient Spontanous Breathing and Patient connected to face mask oxygen  Post-op Assessment: Report given to RN and Post -op Vital signs reviewed and stable  Post vital signs: Reviewed and stable  Last Vitals:  Vitals Value Taken Time  BP 103/64 07/07/22 1041  Temp    Pulse 82 07/07/22 1041  Resp 18 07/07/22 1041  SpO2 100 % 07/07/22 1041  Vitals shown include unvalidated device data.  Last Pain:  Vitals:   07/07/22 0950  TempSrc: Temporal  PainSc: 0-No pain         Complications: No notable events documented.

## 2022-07-08 ENCOUNTER — Encounter: Payer: Self-pay | Admitting: Gastroenterology

## 2022-07-08 LAB — SURGICAL PATHOLOGY

## 2022-07-08 NOTE — Anesthesia Postprocedure Evaluation (Signed)
Anesthesia Post Note  Patient: Alex Carr  Procedure(s) Performed: COLONOSCOPY  Patient location during evaluation: Endoscopy Anesthesia Type: General Level of consciousness: awake and alert Pain management: pain level controlled Vital Signs Assessment: post-procedure vital signs reviewed and stable Respiratory status: spontaneous breathing, nonlabored ventilation and respiratory function stable Cardiovascular status: blood pressure returned to baseline and stable Postop Assessment: no apparent nausea or vomiting Anesthetic complications: no   No notable events documented.   Last Vitals:  Vitals:   07/07/22 1051 07/07/22 1101  BP: 116/75 111/74  Pulse: 73 83  Resp: 14 16  Temp:    SpO2: 100% 100%    Last Pain:  Vitals:   07/08/22 0731  TempSrc:   PainSc: 0-No pain                 [place] Ouch

## 2022-07-10 ENCOUNTER — Encounter: Payer: Self-pay | Admitting: Gastroenterology

## 2022-07-29 ENCOUNTER — Encounter: Payer: Self-pay | Admitting: Internal Medicine

## 2022-07-29 ENCOUNTER — Ambulatory Visit (INDEPENDENT_AMBULATORY_CARE_PROVIDER_SITE_OTHER): Payer: 59 | Admitting: Internal Medicine

## 2022-07-29 VITALS — BP 136/76 | HR 82 | Temp 97.5°F | Wt 183.0 lb

## 2022-07-29 DIAGNOSIS — L02214 Cutaneous abscess of groin: Secondary | ICD-10-CM

## 2022-07-29 MED ORDER — SULFAMETHOXAZOLE-TRIMETHOPRIM 800-160 MG PO TABS: 1.0000 | ORAL_TABLET | Freq: Two times a day (BID) | ORAL | 0 refills | Status: AC

## 2022-07-29 NOTE — Progress Notes (Signed)
Subjective:    Patient ID: Demontez Novack, male    DOB: [birth year], 56 y.o.   MRN: [id number]  HPI  Patient presents to clinic today with complaint of fever and a lesion in his left groin.  He noticed this 4 days ago. It is tender. It has not drained. He has never had anything like this before. He has tried Tylenol OTC with minimal relief of symptoms.   Review of Systems     Past Medical History:  Diagnosis Date   Arthritis    Chicken pox    Colon cancer ([place]) 2000   History of kidney stones    History of stomach ulcers    Hx of colonic polyps     Current Outpatient Medications  Medication Sig Dispense Refill   naproxen (NAPROSYN) 375 MG tablet Take 1 tablet (375 mg total) by mouth 2 (two) times daily with a meal. 180 tablet 0   omeprazole (PRILOSEC) 40 MG capsule Take 1 capsule (40 mg total) by mouth daily. 30 capsule 5   pyridOXINE (VITAMIN B-6) 100 MG tablet Take 100 mg by mouth 2 (two) times daily.     tadalafil (CIALIS) 20 MG tablet Take 0.5-1 tablets (10-20 mg total) by mouth every other day as needed for erectile dysfunction. 30 tablet 1   Zinc 25 MG TABS Take by mouth.     No current facility-administered medications for this visit.    Allergies  Allergen Reactions   Fluconazole Rash    Family History  Problem Relation Age of Onset   Cancer Father        Cancerous Polyps   Heart disease Father    Hyperlipidemia Father    Cancer Paternal Grandfather        Lung    Social History   Socioeconomic History   Marital status: Married    Spouse name: Not on file   Number of children: Not on file   Years of education: Not on file   Highest education level: Not on file  Occupational History   Not on file  Tobacco Use   Smoking status: Former    Types: Cigarettes    Quit date: 11/29/1998    Years since quitting: 23.6   Smokeless tobacco: Never   Tobacco comments:    Quit in 1999  Vaping Use   Vaping Use: Never used  Substance and Sexual Activity    Alcohol use: No   Drug use: No   Sexual activity: Yes  Other Topics Concern   Not on file  Social History Narrative   Not on file   Social Determinants of Health   Financial Resource Strain: Not on file  Food Insecurity: Not on file  Transportation Needs: Not on file  Physical Activity: Not on file  Stress: Not on file  Social Connections: Not on file  Intimate Partner Violence: Not on file     Constitutional: Patient reports fever.  Denies malaise, fatigue, headache or abrupt weight changes.  Respiratory: Denies difficulty breathing, shortness of breath, cough or sputum production.   Cardiovascular: Denies chest pain, chest tightness, palpitations or swelling in the hands or feet.  Skin: Patient reports bump of left groin.  Denies rashes, or ulcercations.   No other specific complaints in a complete review of systems (except as listed in HPI above).  Objective:   Physical Exam  BP 136/76 (BP Location: Left Arm, Patient Position: Sitting, Cuff Size: Normal)   Pulse 82   Temp (!) 97.5 F (  36.4 C) (Temporal)   Wt 183 lb (83 kg)   SpO2 99%   BMI 27.83 kg/m   Wt Readings from Last 3 Encounters:  07/06/22 180 lb 5.7 oz (81.8 kg)  06/03/22 190 lb (86.2 kg)  04/02/22 206 lb (93.4 kg)    General: Appears her stated age, overweight, in NAD. Skin: Warm, dry and intact. 4 cm by 3.5 cm abscess noted of left groin. Cardiovascular: Normal rate and rhythm.  Pulmonary/Chest: Normal effort and positive vesicular breath sounds.  Neurological: Alert and oriented.  BMET    Component Value Date/Time   NA 139 04/02/2022 0844   NA 138 11/21/2014 1758   K 4.3 04/02/2022 0844   K 3.9 11/21/2014 1758   CL 104 04/02/2022 0844   CL 105 11/21/2014 1758   CO2 28 04/02/2022 0844   CO2 25 11/21/2014 1758   GLUCOSE 111 04/02/2022 0844   GLUCOSE 88 11/21/2014 1758   BUN 15 04/02/2022 0844   BUN 14 11/21/2014 1758   CREATININE 0.78 04/02/2022 0844   CALCIUM 9.4 04/02/2022 0844    CALCIUM 8.9 11/21/2014 1758   GFRNONAA >60 11/21/2014 1758   GFRAA >60 11/21/2014 1758    Lipid Panel     Component Value Date/Time   CHOL 127 04/02/2022 0844   TRIG 109 04/02/2022 0844   HDL 38 (L) 04/02/2022 0844   CHOLHDL 3.3 04/02/2022 0844   VLDL 13.6 01/11/2017 1520   LDLCALC 70 04/02/2022 0844    CBC    Component Value Date/Time   WBC 4.1 04/02/2022 0844   RBC 4.39 04/02/2022 0844   HGB 14.0 04/02/2022 0844   HGB 13.9 11/21/2014 1758   HCT 41.2 04/02/2022 0844   HCT 41.9 11/21/2014 1758   PLT 183 04/02/2022 0844   PLT 229 11/21/2014 1758   MCV 93.8 04/02/2022 0844   MCV 92 11/21/2014 1758   MCH 31.9 04/02/2022 0844   MCHC 34.0 04/02/2022 0844   RDW 13.2 04/02/2022 0844   RDW 13.1 11/21/2014 1758   LYMPHSABS 1.8 11/21/2014 1758   MONOABS 0.6 11/21/2014 1758   EOSABS 0.1 11/21/2014 1758   BASOSABS 0.0 11/21/2014 1758    Hgb A1C Lab Results  Component Value Date   HGBA1C 8.2 (A) 03/02/2022           Assessment & Plan:  Abscess of Left Groin:  I&D today, see procedure note Encourage warm compresses to encourage drainage Rx for Septra DS 1 tab p.o. twice daily x 10 days  Procedure note:  Discussed risk of procedure including pain, infection and bleeding Area cleansed with Betadine x 3 Area numbed with lidocaine 1% with epinephrine Area incised with #11 blade Moderate amount of pus and blood obtained Area cleansed with NS, covered with triple antibiotic ointment and dressing Patient tolerated well, no complications   RTC in 4 months for your annual exam Webb Silversmith, NP

## 2022-07-29 NOTE — Patient Instructions (Signed)
Skin Abscess  A skin abscess is an infected area of your skin that contains pus and other material. An abscess can happen in any part of your body. Some abscesses break open (rupture) on their own. Most continue to get worse unless they are treated. The infection can spread deeper into the body and into your blood, which can make you feel sick. A skin abscess is caused by germs that enter the skin through a cut or scrape. It can also be caused by blocked oil and sweat glands or infected hair follicles. This condition is usually treated by: Draining the pus. Taking antibiotic medicines. Placing a warm, wet washcloth over the abscess. Follow these instructions at home: Medicines  Take over-the-counter and prescription medicines only as told by your doctor. If you were prescribed an antibiotic medicine, take it as told by your doctor. Do not stop taking the antibiotic even if you start to feel better. Abscess care  If you have an abscess that has not drained, place a warm, clean, wet washcloth over the abscess several times a day. Do this as told by your doctor. Follow instructions from your doctor about how to take care of your abscess. Make sure you: Cover the abscess with a bandage (dressing). Change your bandage or gauze as told by your doctor. Wash your hands with soap and water before you change the bandage or gauze. If you cannot use soap and water, use hand sanitizer. Check your abscess every day for signs that the infection is getting worse. Check for: More redness, swelling, or pain. More fluid or blood. Warmth. More pus or a bad smell. General instructions To avoid spreading the infection: Do not share personal care items, towels, or hot tubs with others. Avoid making skin-to-skin contact with other people. Keep all follow-up visits as told by your doctor. This is important. Contact a doctor if: You have more redness, swelling, or pain around your abscess. You have more fluid  or blood coming from your abscess. Your abscess feels warm when you touch it. You have more pus or a bad smell coming from your abscess. Your muscles ache. You feel sick. Get help right away if: You have very bad (severe) pain. You see red streaks on your skin spreading away from the abscess. You see redness that spreads quickly. You have a fever or chills. Summary A skin abscess is an infected area of your skin that contains pus and other material. The abscess is caused by germs that enter the skin through a cut or scrape. It can also be caused by blocked oil and sweat glands or infected hair follicles. Follow your doctor's instructions on caring for your abscess, taking medicines, preventing infections, and keeping follow-up visits. This information is not intended to replace advice given to you by your health care provider. Make sure you discuss any questions you have with your health care provider. Document Revised: 03/12/2022 Document Reviewed: 09/15/2021 Elsevier Patient Education  2023 [employer]  

## 2022-10-07 ENCOUNTER — Other Ambulatory Visit: Payer: Self-pay | Admitting: Internal Medicine

## 2022-10-07 NOTE — Telephone Encounter (Signed)
Requested Prescriptions  Pending Prescriptions Disp Refills  . naproxen (NAPROSYN) 375 MG tablet [Pharmacy Med Name: Naproxen 375 MG Oral Tablet] 180 tablet 0    Sig: TAKE 1 TABLET BY MOUTH TWICE DAILY WITH A MEAL     Analgesics:  NSAIDS Failed - 10/07/2022  5:05 PM      Failed - Manual Review: Labs are only required if the patient has taken medication for more than 8 weeks.      Passed - Cr in normal range and within 360 days    Creat  Date Value Ref Range Status  04/02/2022 0.78 0.70 - 1.30 mg/dL Final         Passed - HGB in normal range and within 360 days    Hemoglobin  Date Value Ref Range Status  04/02/2022 14.0 13.2 - 17.1 g/dL Final   HGB  Date Value Ref Range Status  11/21/2014 13.9 13.0 - 18.0 g/dL Final         Passed - PLT in normal range and within 360 days    Platelets  Date Value Ref Range Status  04/02/2022 183 140 - 400 Thousand/uL Final   Platelet  Date Value Ref Range Status  11/21/2014 229 150 - 440 x10 3/mm 3 Final         Passed - HCT in normal range and within 360 days    HCT  Date Value Ref Range Status  04/02/2022 41.2 38.5 - 50.0 % Final  11/21/2014 41.9 40.0 - 52.0 % Final         Passed - eGFR is 30 or above and within 360 days    EGFR (African American)  Date Value Ref Range Status  11/21/2014 >60 >63m/min Final   EGFR (Non-African Amer.)  Date Value Ref Range Status  11/21/2014 >60 >631mmin Final    Comment:    eGFR values <6051min/1.73 m2 may be an indication of chronic kidney disease (CKD). Calculated eGFR, using the MRDR Study equation, is useful in  patients with stable renal function. The eGFR calculation will not be reliable in acutely ill patients when serum creatinine is changing rapidly. It is not useful in patients on dialysis. The eGFR calculation may not be applicable to patients at the low and high extremes of body sizes, pregnant women, and vegetarians.    GFR  Date Value Ref Range Status  02/07/2019 85.90  >60.00 mL/min Final   eGFR  Date Value Ref Range Status  04/02/2022 105 > OR = 60 mL/min/1.10m70mnal    Comment:    The eGFR is based on the CKD-EPI 2021 equation. To calculate  the new eGFR from a previous Creatinine or Cystatin C result, go to https://www.kidney.org/professionals/ kdoqi/gfr%5Fcalculator          Passed - Patient is not pregnant      Passed - Valid encounter within last 12 months    Recent Outpatient Visits          2 months ago [facility] left groin   [facility]   4 months ago Type 2 diabetes mellitus with hyperglycemia, without long-term current use of insulin [facility]   6 [facility]   7 months ago Type 2 diabetes mellitus with hyperglycemia, without long-term current use of insulin [facility]  Future Appointments            In 2 months Baity, Coralie Keens, NP [facility], [facility]

## 2022-11-05 ENCOUNTER — Other Ambulatory Visit: Payer: Self-pay | Admitting: Internal Medicine

## 2022-11-05 NOTE — Telephone Encounter (Signed)
Requested medication (s) are due for refill today: na  Requested medication (s) are on the active medication list: yes  Last refill:  03/02/22 #30 1 refills  Future visit scheduled: yes in 1 month  Notes to clinic:   no refills remain. Will be seen in 1 month. Do you want to refill Rx?     Requested Prescriptions  Pending Prescriptions Disp Refills   tadalafil (CIALIS) 20 MG tablet [Pharmacy Med Name: Tadalafil 20 MG Oral Tablet] 30 tablet 0    Sig: TAKE 1/2 TO 1 (ONE-HALF TO ONE) TABLET BY MOUTH EVERY OTHER DAY AS NEEDED FOR ERECTILE DYSFUNCTION     Urology: Erectile Dysfunction Agents Passed - 11/05/2022  4:54 PM      Passed - AST in normal range and within 360 days    AST  Date Value Ref Range Status  04/02/2022 25 10 - 35 U/L Final   SGOT(AST)  Date Value Ref Range Status  11/21/2014 29 15 - 37 Unit/L Final         Passed - ALT in normal range and within 360 days    ALT  Date Value Ref Range Status  04/02/2022 29 9 - 46 U/L Final   SGPT (ALT)  Date Value Ref Range Status  11/21/2014 40 U/L Final    Comment:    14-63 NOTE: New Reference Range 07/10/14          Passed - Last BP in normal range    BP Readings from Last 1 Encounters:  07/29/22 136/76         Passed - Valid encounter within last 12 months    Recent Outpatient Visits           3 months ago Abscess of left groin   [facility] [place], Coralie Keens, NP   5 months ago Type 2 diabetes mellitus with hyperglycemia, without long-term current use of insulin [facility])   [facility] [place], Coralie Keens, NP   7 months ago [facility] [place], [place] W, NP   8 months ago Type 2 diabetes mellitus with hyperglycemia, without long-term current use of insulin [facility])   [facility] [place], Coralie Keens, NP       Future Appointments             In 1 month Baity, Coralie Keens, NP [facility], [facility]

## 2022-12-11 ENCOUNTER — Other Ambulatory Visit: Payer: Self-pay | Admitting: Internal Medicine

## 2022-12-11 DIAGNOSIS — K219 Gastro-esophageal reflux disease without esophagitis: Secondary | ICD-10-CM

## 2022-12-11 NOTE — Telephone Encounter (Signed)
Requested medication (s) are due for refill today: routing for approval  Requested medication (s) are on the active medication list: yes  Last refill:  05/29/22  Future visit scheduled:yes  Notes to clinic:  Unable to refill per protocol, courtesy refill already given, routing for provider approval.      Requested Prescriptions  Pending Prescriptions Disp Refills   omeprazole (PRILOSEC) 40 MG capsule [Pharmacy Med Name: Omeprazole 40 MG Oral Capsule Delayed Release] 90 capsule 0    Sig: Take 1 capsule by mouth once daily     There is no refill protocol information for this order

## 2022-12-16 ENCOUNTER — Encounter: Payer: Self-pay | Admitting: Internal Medicine

## 2022-12-16 ENCOUNTER — Ambulatory Visit (INDEPENDENT_AMBULATORY_CARE_PROVIDER_SITE_OTHER): Payer: Medicaid Other | Admitting: Internal Medicine

## 2022-12-16 ENCOUNTER — Telehealth: Payer: Self-pay

## 2022-12-16 VITALS — BP 116/71 | HR 71 | Temp 98.0°F | Resp 18 | Ht 68.0 in | Wt 178.8 lb

## 2022-12-16 DIAGNOSIS — Z0001 Encounter for general adult medical examination with abnormal findings: Secondary | ICD-10-CM | POA: Diagnosis not present

## 2022-12-16 DIAGNOSIS — Z6827 Body mass index (BMI) 27.0-27.9, adult: Secondary | ICD-10-CM | POA: Diagnosis not present

## 2022-12-16 DIAGNOSIS — K219 Gastro-esophageal reflux disease without esophagitis: Secondary | ICD-10-CM

## 2022-12-16 DIAGNOSIS — Z125 Encounter for screening for malignant neoplasm of prostate: Secondary | ICD-10-CM

## 2022-12-16 DIAGNOSIS — E1165 Type 2 diabetes mellitus with hyperglycemia: Secondary | ICD-10-CM | POA: Diagnosis not present

## 2022-12-16 DIAGNOSIS — Z23 Encounter for immunization: Secondary | ICD-10-CM | POA: Diagnosis not present

## 2022-12-16 DIAGNOSIS — Z1159 Encounter for screening for other viral diseases: Secondary | ICD-10-CM | POA: Diagnosis not present

## 2022-12-16 DIAGNOSIS — E663 Overweight: Secondary | ICD-10-CM

## 2022-12-16 LAB — POCT GLYCOSYLATED HEMOGLOBIN (HGB A1C): HbA1c, POC (prediabetic range): 5.1 % — AB (ref 5.7–6.4)

## 2022-12-16 MED ORDER — OMEPRAZOLE 40 MG PO CPDR: 40.0000 mg | DELAYED_RELEASE_CAPSULE | Freq: Every day | ORAL | 0 refills | Status: DC

## 2022-12-16 MED ORDER — NAPROXEN 375 MG PO TABS: 375.0000 mg | ORAL_TABLET | Freq: Two times a day (BID) | ORAL | 0 refills | Status: DC

## 2022-12-16 NOTE — Telephone Encounter (Signed)
Open in error

## 2022-12-16 NOTE — Patient Instructions (Signed)
Health Maintenance, Male Adopting a healthy lifestyle and getting preventive care are important in promoting health and wellness. Ask your health care provider about: The right schedule for you to have regular tests and exams. Things you can do on your own to prevent diseases and keep yourself healthy. What should I know about diet, weight, and exercise? Eat a healthy diet  Eat a diet that includes plenty of vegetables, fruits, low-fat dairy products, and lean protein. Do not eat a lot of foods that are high in solid fats, added sugars, or sodium. Maintain a healthy weight Body mass index (BMI) is a measurement that can be used to identify possible weight problems. It estimates body fat based on height and weight. Your health care provider can help determine your BMI and help you achieve or maintain a healthy weight. Get regular exercise Get regular exercise. This is one of the most important things you can do for your health. Most adults should: Exercise for at least 150 minutes each week. The exercise should increase your heart rate and make you sweat (moderate-intensity exercise). Do strengthening exercises at least twice a week. This is in addition to the moderate-intensity exercise. Spend less time sitting. Even light physical activity can be beneficial. Watch cholesterol and blood lipids Have your blood tested for lipids and cholesterol at 57 years of age, then have this test every 5 years. You may need to have your cholesterol levels checked more often if: Your lipid or cholesterol levels are high. You are older than 57 years of age. You are at high risk for heart disease. What should I know about cancer screening? Many types of cancers can be detected early and may often be prevented. Depending on your health history and family history, you may need to have cancer screening at various ages. This may include screening for: Colorectal cancer. Prostate cancer. Skin cancer. Lung  cancer. What should I know about heart disease, diabetes, and high blood pressure? Blood pressure and heart disease High blood pressure causes heart disease and increases the risk of stroke. This is more likely to develop in people who have high blood pressure readings or are overweight. Talk with your health care provider about your target blood pressure readings. Have your blood pressure checked: Every 3-5 years if you are 18-39 years of age. Every year if you are 40 years old or older. If you are between the ages of 65 and 75 and are a current or former smoker, ask your health care provider if you should have a one-time screening for abdominal aortic aneurysm (AAA). Diabetes Have regular diabetes screenings. This checks your fasting blood sugar level. Have the screening done: Once every three years after age 45 if you are at a normal weight and have a low risk for diabetes. More often and at a younger age if you are overweight or have a high risk for diabetes. What should I know about preventing infection? Hepatitis B If you have a higher risk for hepatitis B, you should be screened for this virus. Talk with your health care provider to find out if you are at risk for hepatitis B infection. Hepatitis C Blood testing is recommended for: Everyone born from 1945 through 1965. Anyone with known risk factors for hepatitis C. Sexually transmitted infections (STIs) You should be screened each year for STIs, including gonorrhea and chlamydia, if: You are sexually active and are younger than 57 years of age. You are older than 57 years of age and your   health care provider tells you that you are at risk for this type of infection. Your sexual activity has changed since you were last screened, and you are at increased risk for chlamydia or gonorrhea. Ask your health care provider if you are at risk. Ask your health care provider about whether you are at high risk for HIV. Your health care provider  may recommend a prescription medicine to help prevent HIV infection. If you choose to take medicine to prevent HIV, you should first get tested for HIV. You should then be tested every 3 months for as long as you are taking the medicine. Follow these instructions at home: Alcohol use Do not drink alcohol if your health care provider tells you not to drink. If you drink alcohol: Limit how much you have to 0-2 drinks a day. Know how much alcohol is in your drink. In the [place] one drink equals one 12 oz bottle of beer (355 mL), one 5 oz glass of wine (148 mL), or one 1 oz glass of hard liquor (44 mL). Lifestyle Do not use any products that contain nicotine or tobacco. These products include cigarettes, chewing tobacco, and vaping devices, such as e-cigarettes. If you need help quitting, ask your health care provider. Do not use street drugs. Do not share needles. Ask your health care provider for help if you need support or information about quitting drugs. General instructions Schedule regular health, dental, and eye exams. Stay current with your vaccines. Tell your health care provider if: You often feel depressed. You have ever been abused or do not feel safe at home. Summary Adopting a healthy lifestyle and getting preventive care are important in promoting health and wellness. Follow your health care provider's instructions about healthy diet, exercising, and getting tested or screened for diseases. Follow your health care provider's instructions on monitoring your cholesterol and blood pressure. This information is not intended to replace advice given to you by your health care provider. Make sure you discuss any questions you have with your health care provider. Document Revised: 04/28/2021 Document Reviewed: 04/28/2021 Elsevier Patient Education  2023 Elsevier Inc.  

## 2022-12-16 NOTE — Addendum Note (Signed)
Addended by: Wilson Singer on: 12/16/2022 09:38 AM   Modules accepted: Orders

## 2022-12-16 NOTE — Addendum Note (Signed)
Addended by: Jearld Fenton on: 12/16/2022 08:41 AM   Modules accepted: Orders

## 2022-12-16 NOTE — Assessment & Plan Note (Signed)
Encourage diet and exercise for weight loss 

## 2022-12-16 NOTE — Progress Notes (Signed)
Subjective:    Patient ID: Alex Carr, male    DOB: 27-Nov-1966, 56 y.o.   MRN: [id number]  HPI  Patient presents to clinic today for his annual exam.  Flu: never Tetanus: 11/2015 Pneumovax: Never COVID: Moderna x 2 Zostavax: 06/2016 Shingrix: Never PSA screening: 12/2016 Colon screening: 06/2022 Vision screening: as needed Dentist: as needed  Diet: He does eat meat. He consumes fruits and veggies. He does eat some fried foods. He drinks mostly coffee, water. Exercise: Walking  Review of Systems     Past Medical History:  Diagnosis Date   Arthritis    Chicken pox    Colon cancer ([place]) 2000   History of kidney stones    History of stomach ulcers    Hx of colonic polyps     Current Outpatient Medications  Medication Sig Dispense Refill   naproxen (NAPROSYN) 375 MG tablet TAKE 1 TABLET BY MOUTH TWICE DAILY WITH A MEAL 180 tablet 0   omeprazole (PRILOSEC) 40 MG capsule Take 1 capsule by mouth once daily 90 capsule 0   pyridOXINE (VITAMIN B-6) 100 MG tablet Take 100 mg by mouth 2 (two) times daily.     tadalafil (CIALIS) 20 MG tablet TAKE 1/2 TO 1 (ONE-HALF TO ONE) TABLET BY MOUTH EVERY OTHER DAY AS NEEDED FOR ERECTILE DYSFUNCTION 30 tablet 0   Zinc 25 MG TABS Take by mouth.     No current facility-administered medications for this visit.    Allergies  Allergen Reactions   Fluconazole Rash    Family History  Problem Relation Age of Onset   Cancer Father        Cancerous Polyps   Heart disease Father    Hyperlipidemia Father    Cancer Paternal Grandfather        Lung    Social History   Socioeconomic History   Marital status: Married    Spouse name: Not on file   Number of children: Not on file   Years of education: Not on file   Highest education level: Not on file  Occupational History   Not on file  Tobacco Use   Smoking status: Former    Types: Cigarettes    Quit date: 11/29/1998    Years since quitting: 24.0   Smokeless tobacco: Never    Tobacco comments:    Quit in 1999  Vaping Use   Vaping Use: Never used  Substance and Sexual Activity   Alcohol use: No   Drug use: No   Sexual activity: Yes  Other Topics Concern   Not on file  Social History Narrative   Not on file   Social Determinants of Health   Financial Resource Strain: Not on file  Food Insecurity: Not on file  Transportation Needs: Not on file  Physical Activity: Not on file  Stress: Not on file  Social Connections: Not on file  Intimate Partner Violence: Not on file     Constitutional: Denies fever, malaise, fatigue, headache or abrupt weight changes.  HEENT: Denies eye pain, eye redness, ear pain, ringing in the ears, wax buildup, runny nose, nasal congestion, bloody nose, or sore throat. Respiratory: Denies difficulty breathing, shortness of breath, cough or sputum production.   Cardiovascular: Denies chest pain, chest tightness, palpitations or swelling in the hands or feet.  Gastrointestinal: Denies abdominal pain, bloating, constipation, diarrhea or blood in the stool.  GU: Denies urgency, frequency, pain with urination, burning sensation, blood in urine, odor or discharge. Musculoskeletal: Patient reports joint  pain.  Denies decrease in range of motion, difficulty with gait, muscle pain or joint swelling.  Skin: Pt reports lesion on right middle finger. Denies redness, rashes, or ulcercations.  Neurological: Denies dizziness, difficulty with memory, difficulty with speech or problems with balance and coordination.  Psych: Denies anxiety, depression, SI/HI.  No other specific complaints in a complete review of systems (except as listed in HPI above).  Objective:   Physical Exam  BP 116/71 (BP Location: Left Arm, Patient Position: Sitting, Cuff Size: Normal)   Pulse 71   Temp 98 F (36.7 C) (Oral)   Resp 18   Ht _0  (1.727 m)   Wt 178 lb 12.8 oz (81.1 kg)   SpO2 100%   BMI 27.19 kg/m   Wt Readings from Last 3 Encounters:  07/29/22  183 lb (83 kg)  07/06/22 180 lb 5.7 oz (81.8 kg)  06/03/22 190 lb (86.2 kg)    General: Appears his stated age, overweight, in NAD. Skin: Warm, dry and intact.  4 mm papule noted just above the nailbed of the right middle finger.  Sebaceous cyst noted of left upper arm.  No ulcerations noted. HEENT: Head: normal shape and size; Eyes: sclera white, no icterus, conjunctiva pink, PERRLA and EOMs intact;  Neck:  Neck supple, trachea midline. No masses, lumps or thyromegaly present.  Cardiovascular: Normal rate and rhythm. S1,S2 noted.  No murmur, rubs or gallops noted. No JVD or BLE edema. No carotid bruits noted. Pulmonary/Chest: Normal effort and positive vesicular breath sounds. No respiratory distress. No wheezes, rales or ronchi noted.  Abdomen: Normal bowel sounds.  Musculoskeletal: Strength 5/5 BUE/BLE.  No difficulty with gait.  Neurological: Alert and oriented. Cranial nerves II-XII grossly intact. Coordination normal.  Psychiatric: Mood and affect normal. Behavior is normal. Judgment and thought content normal.     BMET    Component Value Date/Time   NA 139 04/02/2022 0844   NA 138 11/21/2014 1758   K 4.3 04/02/2022 0844   K 3.9 11/21/2014 1758   CL 104 04/02/2022 0844   CL 105 11/21/2014 1758   CO2 28 04/02/2022 0844   CO2 25 11/21/2014 1758   GLUCOSE 111 04/02/2022 0844   GLUCOSE 88 11/21/2014 1758   BUN 15 04/02/2022 0844   BUN 14 11/21/2014 1758   CREATININE 0.78 04/02/2022 0844   CALCIUM 9.4 04/02/2022 0844   CALCIUM 8.9 11/21/2014 1758   GFRNONAA >60 11/21/2014 1758   GFRAA >60 11/21/2014 1758    Lipid Panel     Component Value Date/Time   CHOL 127 04/02/2022 0844   TRIG 109 04/02/2022 0844   HDL 38 (L) 04/02/2022 0844   CHOLHDL 3.3 04/02/2022 0844   VLDL 13.6 01/11/2017 1520   LDLCALC 70 04/02/2022 0844    CBC    Component Value Date/Time   WBC 4.1 04/02/2022 0844   RBC 4.39 04/02/2022 0844   HGB 14.0 04/02/2022 0844   HGB 13.9 11/21/2014 1758    HCT 41.2 04/02/2022 0844   HCT 41.9 11/21/2014 1758   PLT 183 04/02/2022 0844   PLT 229 11/21/2014 1758   MCV 93.8 04/02/2022 0844   MCV 92 11/21/2014 1758   MCH 31.9 04/02/2022 0844   MCHC 34.0 04/02/2022 0844   RDW 13.2 04/02/2022 0844   RDW 13.1 11/21/2014 1758   LYMPHSABS 1.8 11/21/2014 1758   MONOABS 0.6 11/21/2014 1758   EOSABS 0.1 11/21/2014 1758   BASOSABS 0.0 11/21/2014 1758    Hgb A1C Lab Results  Component Value Date   HGBA1C 8.2 (A) 03/02/2022           Assessment & Plan:   Preventative Health Maintenance:  Flu shot today Tetanus UTD Encouraged him to get his COVID booster Pneumovax today Zostavax UTD, discussed Shingrix vaccine, he will check coverage with his insurance company and schedule a nurse visit if he would like to have this done Colon screening UTD Encouraged him to consume a balanced diet and exercise regimen Advised him to see an eye doctor and dentist annually We will check CBC, c-Met, lipid, A1c, PSA, hep C today  RTC in 6 months, follow-up chronic conditions Webb Silversmith, NP

## 2022-12-17 LAB — LIPID PANEL
Cholesterol: 117 mg/dL (ref <200)
HDL: 60 mg/dL (ref >=40)
LDL Cholesterol (Calc): 47 mg/dL (calc)
Non-HDL Cholesterol (Calc): 57 mg/dL (calc) (ref <130)
Total CHOL/HDL Ratio: 2 (calc) (ref <5.0)
Triglycerides: 36 mg/dL (ref <150)

## 2022-12-17 LAB — COMPLETE METABOLIC PANEL WITH GFR
AG Ratio: 1.9 (calc) (ref 1.0–2.5)
ALT: 28 U/L (ref 9–46)
AST: 21 U/L (ref 10–35)
Albumin: 3.9 g/dL (ref 3.6–5.1)
Alkaline phosphatase (APISO): 107 U/L (ref 35–144)
BUN/Creatinine Ratio: 37 (calc) — ABNORMAL HIGH (ref 6–22)
BUN: 25 mg/dL (ref 7–25)
CO2: 29 mmol/L (ref 20–32)
Calcium: 9.1 mg/dL (ref 8.6–10.3)
Chloride: 106 mmol/L (ref 98–110)
Creat: 0.68 mg/dL — ABNORMAL LOW (ref 0.70–1.30)
Globulin: 2.1 g/dL (calc) (ref 1.9–3.7)
Glucose, Bld: 112 mg/dL — ABNORMAL HIGH (ref 65–99)
Potassium: 4.1 mmol/L (ref 3.5–5.3)
Sodium: 140 mmol/L (ref 135–146)
Total Bilirubin: 0.5 mg/dL (ref 0.2–1.2)
Total Protein: 6 g/dL — ABNORMAL LOW (ref 6.1–8.1)
eGFR: 108 mL/min/{1.73_m2} (ref >=60)

## 2022-12-17 LAB — CBC
HCT: 38.6 % (ref 38.5–50.0)
Hemoglobin: 13.2 g/dL (ref 13.2–17.1)
MCH: 32.1 pg (ref 27.0–33.0)
MCHC: 34.2 g/dL (ref 32.0–36.0)
MCV: 93.9 fL (ref 80.0–100.0)
MPV: 10.1 fL (ref 7.5–12.5)
Platelets: 185 10*3/uL (ref 140–400)
RBC: 4.11 10*6/uL — ABNORMAL LOW (ref 4.20–5.80)
RDW: 12.5 % (ref 11.0–15.0)
WBC: 3.4 10*3/uL — ABNORMAL LOW (ref 3.8–10.8)

## 2022-12-17 LAB — HEMOGLOBIN A1C
Hgb A1c MFr Bld: 5.6 % of total Hgb (ref <5.7)
Mean Plasma Glucose: 114 mg/dL
eAG (mmol/L): 6.3 mmol/L

## 2022-12-17 LAB — PSA: PSA: 0.43 ng/mL (ref <=4.00)

## 2022-12-17 LAB — HEPATITIS C ANTIBODY: Hepatitis C Ab: NONREACTIVE

## 2023-02-08 ENCOUNTER — Encounter: Payer: Self-pay | Admitting: Internal Medicine

## 2023-02-08 DIAGNOSIS — R2230 Localized swelling, mass and lump, unspecified upper limb: Secondary | ICD-10-CM

## 2023-03-17 ENCOUNTER — Other Ambulatory Visit: Payer: Self-pay | Admitting: Internal Medicine

## 2023-03-17 DIAGNOSIS — K219 Gastro-esophageal reflux disease without esophagitis: Secondary | ICD-10-CM

## 2023-03-17 NOTE — Telephone Encounter (Signed)
Requested medication (s) are due for refill today: yes  Requested medication (s) are on the active medication list: yes  Last refill:  12/16/22 #90 0 refills  Future visit scheduled: yes in 3 months   Notes to clinic:  no refills remain. Do you want to refill Rx?     Requested Prescriptions  Pending Prescriptions Disp Refills   omeprazole (PRILOSEC) 40 MG capsule [Pharmacy Med Name: Omeprazole 40 MG Oral Capsule Delayed Release] 90 capsule 0    Sig: Take 1 capsule by mouth once daily     Gastroenterology: Proton Pump Inhibitors Passed - 03/17/2023  3:03 PM      Passed - Valid encounter within last 12 months    Recent Outpatient Visits           3 months ago Encounter for general adult medical examination with abnormal findings   [facility] [place], Coralie Keens, NP   7 months ago Abscess of left groin   [facility] [place], [place] W, NP   9 months ago Type 2 diabetes mellitus with hyperglycemia, without long-term current use of insulin [facility])   [facility] [place], Coralie Keens, NP   11 months ago [facility] [place], [place] W, NP   1 year ago Type 2 diabetes mellitus with hyperglycemia, without long-term current use of insulin [facility])   [facility] [place], Coralie Keens, NP       Future Appointments             In 3 months Baity, Coralie Keens, NP [facility], [facility]

## 2023-04-03 ENCOUNTER — Other Ambulatory Visit: Payer: Self-pay | Admitting: Internal Medicine

## 2023-04-05 NOTE — Telephone Encounter (Signed)
Requested Prescriptions  Pending Prescriptions Disp Refills   naproxen (NAPROSYN) 375 MG tablet [Pharmacy Med Name: Naproxen 375 MG Oral Tablet] 180 tablet 0    Sig: TAKE 1 TABLET BY MOUTH TWICE DAILY WITH A MEAL     Analgesics:  NSAIDS Failed - 04/03/2023  9:18 AM      Failed - Manual Review: Labs are only required if the patient has taken medication for more than 8 weeks.      Failed - Cr in normal range and within 360 days    Creat  Date Value Ref Range Status  12/16/2022 0.68 (L) 0.70 - 1.30 mg/dL Final         Passed - HGB in normal range and within 360 days    Hemoglobin  Date Value Ref Range Status  12/16/2022 13.2 13.2 - 17.1 g/dL Final   HGB  Date Value Ref Range Status  11/21/2014 13.9 13.0 - 18.0 g/dL Final         Passed - PLT in normal range and within 360 days    Platelets  Date Value Ref Range Status  12/16/2022 185 140 - 400 Thousand/uL Final   Platelet  Date Value Ref Range Status  11/21/2014 229 150 - 440 x10 3/mm 3 Final         Passed - HCT in normal range and within 360 days    HCT  Date Value Ref Range Status  12/16/2022 38.6 38.5 - 50.0 % Final  11/21/2014 41.9 40.0 - 52.0 % Final         Passed - eGFR is 30 or above and within 360 days    EGFR (African American)  Date Value Ref Range Status  11/21/2014 >60 >38mL/min Final   EGFR (Non-African Amer.)  Date Value Ref Range Status  11/21/2014 >60 >10mL/min Final    Comment:    eGFR values <66mL/min/1.73 m2 may be an indication of chronic kidney disease (CKD). Calculated eGFR, using the MRDR Study equation, is useful in  patients with stable renal function. The eGFR calculation will not be reliable in acutely ill patients when serum creatinine is changing rapidly. It is not useful in patients on dialysis. The eGFR calculation may not be applicable to patients at the low and high extremes of body sizes, pregnant women, and vegetarians.    GFR  Date Value Ref Range Status  02/07/2019  85.90 >60.00 mL/min Final   eGFR  Date Value Ref Range Status  12/16/2022 108 > OR = 60 mL/min/1.30m2 Final         Passed - Patient is not pregnant      Passed - Valid encounter within last 12 months    Recent Outpatient Visits           3 months ago Encounter for general adult medical examination with abnormal findings   [facility] [place], Salvadore Oxford, NP   8 months ago Abscess of left groin   [place] [facility] [place], [place] W, NP   10 months ago Type 2 diabetes mellitus with hyperglycemia, without long-term current use of insulin [facility])   [place] [facility] [place], Salvadore Oxford, NP   1 year ago Lightheadedness   [facility] [place], [place] W, NP   1 year ago Type 2 diabetes mellitus with hyperglycemia, without long-term current use of insulin [facility])   [place] [facility] [place], Salvadore Oxford, [place]  Future Appointments             In 2 months Baity, Salvadore Oxford, NP [facility], [facility]

## 2023-04-27 DIAGNOSIS — L720 Epidermal cyst: Secondary | ICD-10-CM | POA: Diagnosis not present

## 2023-04-27 DIAGNOSIS — M71341 Other bursal cyst, right hand: Secondary | ICD-10-CM | POA: Diagnosis not present

## 2023-05-13 DIAGNOSIS — L720 Epidermal cyst: Secondary | ICD-10-CM | POA: Diagnosis not present

## 2023-05-13 DIAGNOSIS — L989 Disorder of the skin and subcutaneous tissue, unspecified: Secondary | ICD-10-CM | POA: Diagnosis not present

## 2023-05-20 DIAGNOSIS — L720 Epidermal cyst: Secondary | ICD-10-CM | POA: Diagnosis not present

## 2023-06-17 ENCOUNTER — Encounter: Payer: Self-pay | Admitting: Internal Medicine

## 2023-06-17 ENCOUNTER — Ambulatory Visit: Payer: Medicaid Other | Admitting: Internal Medicine

## 2023-06-17 VITALS — BP 108/72 | HR 69 | Temp 95.7°F | Wt 173.0 lb

## 2023-06-17 DIAGNOSIS — E119 Type 2 diabetes mellitus without complications: Secondary | ICD-10-CM

## 2023-06-17 DIAGNOSIS — I10 Essential (primary) hypertension: Secondary | ICD-10-CM

## 2023-06-17 DIAGNOSIS — Z6826 Body mass index (BMI) 26.0-26.9, adult: Secondary | ICD-10-CM | POA: Diagnosis not present

## 2023-06-17 DIAGNOSIS — E663 Overweight: Secondary | ICD-10-CM

## 2023-06-17 DIAGNOSIS — M159 Polyosteoarthritis, unspecified: Secondary | ICD-10-CM | POA: Diagnosis not present

## 2023-06-17 DIAGNOSIS — N528 Other male erectile dysfunction: Secondary | ICD-10-CM

## 2023-06-17 DIAGNOSIS — K219 Gastro-esophageal reflux disease without esophagitis: Secondary | ICD-10-CM

## 2023-06-17 LAB — POCT GLYCOSYLATED HEMOGLOBIN (HGB A1C): HbA1c, POC (prediabetic range): 5.6 % (ref 5.7–6.4)

## 2023-06-17 MED ORDER — OMEPRAZOLE 40 MG PO CPDR: 40.0000 mg | DELAYED_RELEASE_CAPSULE | Freq: Every day | ORAL | 1 refills | Status: DC

## 2023-06-17 MED ORDER — DICLOFENAC SODIUM 75 MG PO TBEC: 75.0000 mg | DELAYED_RELEASE_TABLET | Freq: Two times a day (BID) | ORAL | 1 refills | Status: DC

## 2023-06-17 NOTE — Patient Instructions (Signed)

## 2023-06-17 NOTE — Assessment & Plan Note (Signed)
Controlled off meds We will monitor 

## 2023-06-17 NOTE — Progress Notes (Signed)
Subjective:    Patient ID: Alex Carr, male    DOB: Dec 23, 1965, 57 y.o.   MRN: [id number]  HPI  Patient presents to clinic today for 50-month follow-up of chronic conditions.  HTN: His BP today is 108/72.  He is not taking any antihypertensive medication at this time..  ECG from 05/2018 reviewed.  DM2: His last A1c was 5.1%, 11/2022.  He is not taking any oral diabetic medication at this time.  He does not check his sugars routinely.  He checks his feet routinely.  His last eye exam was > 1 year ago.  Flu 11/2022.  Pneumovax never.  Prevnar 20 11/2022.  COVID Moderna x 2.  GERD: He is not sure what triggers this. He denies breakthrough on omeprazole.  There is no upper GI on file.  OA: Mainly in his right elbow, right wrist and hand.  He takes naproxen as needed with good relief of symptoms.  He does not follow with orthopedics.  ED: Managed with Cialis.  He does not follow with urology.  Review of Systems     Past Medical History:  Diagnosis Date   Arthritis    Chicken pox    Colon cancer (HCC) 2000   History of kidney stones    History of stomach ulcers    Hx of colonic polyps     Current Outpatient Medications  Medication Sig Dispense Refill   Ascorbic Acid (VITAMIN C) 1000 MG tablet Take 1,000 mg by mouth daily.     naproxen (NAPROSYN) 375 MG tablet TAKE 1 TABLET BY MOUTH TWICE DAILY WITH A MEAL 180 tablet 0   omeprazole (PRILOSEC) 40 MG capsule Take 1 capsule by mouth once daily 90 capsule 0   pyridOXINE (VITAMIN B-6) 100 MG tablet Take 100 mg by mouth 2 (two) times daily.     tadalafil (CIALIS) 20 MG tablet TAKE 1/2 TO 1 (ONE-HALF TO ONE) TABLET BY MOUTH EVERY OTHER DAY AS NEEDED FOR ERECTILE DYSFUNCTION 30 tablet 0   Zinc 25 MG TABS Take by mouth.     No current facility-administered medications for this visit.    Allergies  Allergen Reactions   Fluconazole Rash    Family History  Problem Relation Age of Onset   Cancer Father        Cancerous Polyps    Heart disease Father    Hyperlipidemia Father    Cancer Paternal Grandfather        Lung    Social History   Socioeconomic History   Marital status: Married    Spouse name: Not on file   Number of children: Not on file   Years of education: Not on file   Highest education level: 12th grade  Occupational History   Not on file  Tobacco Use   Smoking status: Former    Types: Cigarettes    Quit date: 11/29/1998    Years since quitting: 24.5   Smokeless tobacco: Never   Tobacco comments:    Quit in 1999  Vaping Use   Vaping Use: Never used  Substance and Sexual Activity   Alcohol use: No   Drug use: No   Sexual activity: Yes  Other Topics Concern   Not on file  Social History Narrative   Not on file   Social Determinants of Health   Financial Resource Strain: Low Risk  (06/14/2023)   Overall Financial Resource Strain (CARDIA)    Difficulty of Paying Living Expenses: Not very hard  Food Insecurity: No  Food Insecurity (06/14/2023)   Hunger Vital Sign    Worried About Running Out of Food in the Last Year: Never true    Ran Out of Food in the Last Year: Never true  Transportation Needs: No Transportation Needs (06/14/2023)   PRAPARE - Administrator, Civil Service (Medical): No    Lack of Transportation (Non-Medical): No  Physical Activity: Insufficiently Active (06/14/2023)   Exercise Vital Sign    Days of Exercise per Week: 2 days    Minutes of Exercise per Session: 30 min  Stress: No Stress Concern Present (06/14/2023)   [employer] of Occupational Health - Occupational Stress Questionnaire    Feeling of Stress : Not at all  Social Connections: Moderately Isolated (06/14/2023)   Social Connection and Isolation Panel [NHANES]    Frequency of Communication with Friends and Family: Once a week    Frequency of Social Gatherings with Friends and Family: Once a week    Attends Religious Services: More than 4 times per year    Active Member of [employer] or  Organizations: No    Attends Engineer, structural: Not on file    Marital Status: Married  Catering manager Violence: Not on file     Constitutional: Denies fever, malaise, fatigue, headache or abrupt weight changes.  HEENT: Denies eye pain, eye redness, ear pain, ringing in the ears, wax buildup, runny nose, nasal congestion, bloody nose, or sore throat. Respiratory: Denies difficulty breathing, shortness of breath, cough or sputum production.   Cardiovascular: Denies chest pain, chest tightness, palpitations or swelling in the hands or feet.  Gastrointestinal: Denies abdominal pain, bloating, constipation, diarrhea or blood in the stool.  GU: Patient reports erectile dysfunction.  Denies urgency, frequency, pain with urination, burning sensation, blood in urine, odor or discharge. Musculoskeletal: Patient ports joint pain.  Denies decrease in range of motion, difficulty with gait, muscle pain or joint swelling.  Skin: Pt reports lesion of right middle finger. Denies redness, rashes, or ulcercations.  Neurological: Denies dizziness, difficulty with memory, difficulty with speech or problems with balance and coordination.  Psych: Denies anxiety, depression, SI/HI.  No other specific complaints in a complete review of systems (except as listed in HPI above).  Objective:   Physical Exam  BP 108/72 (BP Location: Right Arm, Patient Position: Sitting, Cuff Size: Normal)   Pulse 69   Temp (!) 95.7 F (35.4 C) (Temporal)   Wt 173 lb (78.5 kg)   SpO2 99%   BMI 26.30 kg/m   Wt Readings from Last 3 Encounters:  12/16/22 178 lb 12.8 oz (81.1 kg)  07/29/22 183 lb (83 kg)  07/06/22 180 lb 5.7 oz (81.8 kg)    General: Appears his stated age, overweight, in NAD. Skin: Warm, dry and intact. 1 cm vesicle noted at nailbed of right 3rd finger. No ulcerations noted. HEENT: Head: normal shape and size; Eyes: sclera white, no icterus, conjunctiva pink, PERRLA and EOMs intact;   Cardiovascular: Normal rate and rhythm. S1,S2 noted.  No murmur, rubs or gallops noted. No JVD or BLE edema. No carotid bruits noted. Pulmonary/Chest: Normal effort and positive vesicular breath sounds. No respiratory distress. No wheezes, rales or ronchi noted.  Abdomen: Soft and nontender. Normal bowel sounds.  Musculoskeletal:  No difficulty with gait.  Neurological: Alert and oriented. Coordination normal.  Psychiatric: Mood and affect normal. Behavior is normal. Judgment and thought content normal.     BMET    Component Value Date/Time   NA 140  12/16/2022 0839   NA 138 11/21/2014 1758   K 4.1 12/16/2022 0839   K 3.9 11/21/2014 1758   CL 106 12/16/2022 0839   CL 105 11/21/2014 1758   CO2 29 12/16/2022 0839   CO2 25 11/21/2014 1758   GLUCOSE 112 (H) 12/16/2022 0839   GLUCOSE 88 11/21/2014 1758   BUN 25 12/16/2022 0839   BUN 14 11/21/2014 1758   CREATININE 0.68 (L) 12/16/2022 0839   CALCIUM 9.1 12/16/2022 0839   CALCIUM 8.9 11/21/2014 1758   GFRNONAA >60 11/21/2014 1758   GFRAA >60 11/21/2014 1758    Lipid Panel     Component Value Date/Time   CHOL 117 12/16/2022 0839   TRIG 36 12/16/2022 0839   HDL 60 12/16/2022 0839   CHOLHDL 2.0 12/16/2022 0839   VLDL 13.6 01/11/2017 1520   LDLCALC 47 12/16/2022 0839    CBC    Component Value Date/Time   WBC 3.4 (L) 12/16/2022 0839   RBC 4.11 (L) 12/16/2022 0839   HGB 13.2 12/16/2022 0839   HGB 13.9 11/21/2014 1758   HCT 38.6 12/16/2022 0839   HCT 41.9 11/21/2014 1758   PLT 185 12/16/2022 0839   PLT 229 11/21/2014 1758   MCV 93.9 12/16/2022 0839   MCV 92 11/21/2014 1758   MCH 32.1 12/16/2022 0839   MCHC 34.2 12/16/2022 0839   RDW 12.5 12/16/2022 0839   RDW 13.1 11/21/2014 1758   LYMPHSABS 1.8 11/21/2014 1758   MONOABS 0.6 11/21/2014 1758   EOSABS 0.1 11/21/2014 1758   BASOSABS 0.0 11/21/2014 1758    Hgb A1C Lab Results  Component Value Date   HGBA1C 5.1 (A) 12/16/2022           Assessment & Plan:      RTC in 6 months for annual exam Nicki Reaper, NP

## 2023-06-17 NOTE — Assessment & Plan Note (Signed)
Continue Cialis as needed 

## 2023-06-17 NOTE — Assessment & Plan Note (Signed)
POCT A1c 5.6% We will check urine microalbumin Encouraged him to consume a low-carb diet and exercise for weight loss No medications Encouraged routine eye exam Encouraged routine foot exam Encouraged him to get a flu shot in the fall Prevnar UTD Encouraged him to get his COVID booster

## 2023-06-17 NOTE — Assessment & Plan Note (Signed)
Encourage diet and exercise for weight loss 

## 2023-06-17 NOTE — Assessment & Plan Note (Signed)
Try to identify and avoid foods that trigger reflux He has failed weaning omeprazole down to 20 mg in the past, will continue 40 mg daily, refilled today

## 2023-06-17 NOTE — Assessment & Plan Note (Signed)
Will trial diclofenac 75 mg twice daily in place of naproxen

## 2023-06-18 ENCOUNTER — Encounter: Payer: Self-pay | Admitting: Internal Medicine

## 2023-06-18 LAB — COMPLETE METABOLIC PANEL WITH GFR
AG Ratio: 1.6 (calc) (ref 1.0–2.5)
ALT: 26 U/L (ref 9–46)
AST: 23 U/L (ref 10–35)
Albumin: 4.3 g/dL (ref 3.6–5.1)
Alkaline phosphatase (APISO): 124 U/L (ref 35–144)
BUN/Creatinine Ratio: 35 (calc) — ABNORMAL HIGH (ref 6–22)
BUN: 31 mg/dL — ABNORMAL HIGH (ref 7–25)
CO2: 26 mmol/L (ref 20–32)
Calcium: 10 mg/dL (ref 8.6–10.3)
Chloride: 103 mmol/L (ref 98–110)
Creat: 0.89 mg/dL (ref 0.70–1.30)
Globulin: 2.7 g/dL (calc) (ref 1.9–3.7)
Glucose, Bld: 107 mg/dL (ref 65–139)
Potassium: 4.9 mmol/L (ref 3.5–5.3)
Sodium: 139 mmol/L (ref 135–146)
Total Bilirubin: 0.5 mg/dL (ref 0.2–1.2)
Total Protein: 7 g/dL (ref 6.1–8.1)
eGFR: 100 mL/min/{1.73_m2} (ref >=60)

## 2023-06-18 LAB — LIPID PANEL
Cholesterol: 155 mg/dL (ref <200)
HDL: 76 mg/dL (ref >=40)
LDL Cholesterol (Calc): 66 mg/dL (calc)
Non-HDL Cholesterol (Calc): 79 mg/dL (calc) (ref <130)
Total CHOL/HDL Ratio: 2 (calc) (ref <5.0)
Triglycerides: 46 mg/dL (ref <150)

## 2023-06-18 LAB — CBC
HCT: 40.5 % (ref 38.5–50.0)
Hemoglobin: 13.5 g/dL (ref 13.2–17.1)
MCH: 31.4 pg (ref 27.0–33.0)
MCHC: 33.3 g/dL (ref 32.0–36.0)
MCV: 94.2 fL (ref 80.0–100.0)
MPV: 9.8 fL (ref 7.5–12.5)
Platelets: 183 10*3/uL (ref 140–400)
RBC: 4.3 10*6/uL (ref 4.20–5.80)
RDW: 13 % (ref 11.0–15.0)
WBC: 3.6 10*3/uL — ABNORMAL LOW (ref 3.8–10.8)

## 2023-06-18 LAB — MICROALBUMIN / CREATININE URINE RATIO
Creatinine, Urine: 39 mg/dL (ref 20–320)
Microalb Creat Ratio: 33 mg/g creat — ABNORMAL HIGH (ref <30)
Microalb, Ur: 1.3 mg/dL

## 2023-06-18 MED ORDER — LISINOPRIL 2.5 MG PO TABS: 2.5000 mg | ORAL_TABLET | Freq: Every day | ORAL | 2 refills | Status: DC

## 2023-06-21 MED ORDER — LOVASTATIN 20 MG PO TABS: 20.0000 mg | ORAL_TABLET | ORAL | 1 refills | Status: DC

## 2023-06-21 NOTE — Addendum Note (Signed)
Addended by: Lorre Munroe on: 06/21/2023 08:57 AM   Modules accepted: Orders

## 2023-08-10 DIAGNOSIS — H5213 Myopia, bilateral: Secondary | ICD-10-CM | POA: Diagnosis not present

## 2023-08-10 LAB — HM DIABETES EYE EXAM

## 2023-08-11 ENCOUNTER — Encounter: Payer: Self-pay | Admitting: Internal Medicine

## 2023-09-14 ENCOUNTER — Other Ambulatory Visit: Payer: Self-pay | Admitting: Internal Medicine

## 2023-09-15 NOTE — Telephone Encounter (Signed)
Requested Prescriptions  Pending Prescriptions Disp Refills   lisinopril (ZESTRIL) 2.5 MG tablet [Pharmacy Med Name: Lisinopril 2.5 MG Oral Tablet] 90 tablet 1    Sig: Take 1 tablet by mouth once daily     Cardiovascular:  ACE Inhibitors Passed - 09/14/2023  2:46 PM      Passed - Cr in normal range and within 180 days    Creat  Date Value Ref Range Status  06/17/2023 0.89 0.70 - 1.30 mg/dL Final   Creatinine, Urine  Date Value Ref Range Status  06/17/2023 39 20 - 320 mg/dL Final         Passed - K in normal range and within 180 days    Potassium  Date Value Ref Range Status  06/17/2023 4.9 3.5 - 5.3 mmol/L Final  11/21/2014 3.9 3.5 - 5.1 mmol/L Final         Passed - Patient is not pregnant      Passed - Last BP in normal range    BP Readings from Last 1 Encounters:  06/17/23 108/72         Passed - Valid encounter within last 6 months    Recent Outpatient Visits           3 months ago Type 2 diabetes mellitus without complication, without long-term current use of insulin [facility])   [place] [facility] [place], Salvadore Oxford, NP   9 months ago Encounter for general adult medical examination with abnormal findings   [place] [facility] [place], Salvadore Oxford, NP   1 year ago Abscess of left groin   [place] [facility] [place], [place] W, NP   1 year ago Type 2 diabetes mellitus with hyperglycemia, without long-term current use of insulin [facility])   [place] [facility] [place], Salvadore Oxford, NP   1 year ago Lightheadedness   [place] [facility] [place], Salvadore Oxford, NP       Future Appointments             In 3 months [place], Salvadore Oxford, NP  [facility], [facility]

## 2023-11-28 ENCOUNTER — Other Ambulatory Visit: Payer: Self-pay | Admitting: Internal Medicine

## 2023-11-30 NOTE — Telephone Encounter (Signed)
Requested Prescriptions  Pending Prescriptions Disp Refills   lovastatin (MEVACOR) 20 MG tablet [Pharmacy Med Name: Lovastatin 20 MG Oral Tablet] 12 tablet 0    Sig: Take 1 tablet by mouth once a week     Cardiovascular:  Antilipid - Statins 2 Failed - 11/28/2023 12:10 PM      Failed - Lipid Panel in normal range within the last 12 months    Cholesterol  Date Value Ref Range Status  06/17/2023 155 <200 mg/dL Final   LDL Cholesterol (Calc)  Date Value Ref Range Status  06/17/2023 66 mg/dL (calc) Final    Comment:    Reference range: <100 . Desirable range <100 mg/dL for primary prevention;   <70 mg/dL for patients with CHD or diabetic patients  with > or = 2 CHD risk factors. Marland Kitchen LDL-C is now calculated using the Martin-Hopkins  calculation, which is a validated novel method providing  better accuracy than the Friedewald equation in the  estimation of LDL-C.  Horald Pollen et al. Lenox Ahr. 1478;295(62): 2061-2068  (http://education.[employer].com/faq/FAQ164)    HDL  Date Value Ref Range Status  06/17/2023 76 > OR = 40 mg/dL Final   Triglycerides  Date Value Ref Range Status  06/17/2023 46 <150 mg/dL Final         Passed - Cr in normal range and within 360 days    Creat  Date Value Ref Range Status  06/17/2023 0.89 0.70 - 1.30 mg/dL Final   Creatinine, Urine  Date Value Ref Range Status  06/17/2023 39 20 - 320 mg/dL Final         Passed - Patient is not pregnant      Passed - Valid encounter within last 12 months    Recent Outpatient Visits           5 months ago Type 2 diabetes mellitus without complication, without long-term current use of insulin [facility])   [place] [facility] [place], Salvadore Oxford, NP   11 months ago Encounter for general adult medical examination with abnormal findings   [place] [facility] [place], Salvadore Oxford, NP   1 year ago Abscess of left groin   [place] [facility] [place], [place], NP   1  year ago Type 2 diabetes mellitus with hyperglycemia, without long-term current use of insulin [facility])   [place] [facility] [place], Salvadore Oxford, NP   1 year ago Lightheadedness   [place] [facility] [employer], Salvadore Oxford, NP       Future Appointments             In 3 weeks Sampson Si, Salvadore Oxford, NP  [facility], [facility]

## 2023-12-04 ENCOUNTER — Other Ambulatory Visit: Payer: Self-pay | Admitting: Internal Medicine

## 2023-12-06 NOTE — Telephone Encounter (Signed)
Requested Prescriptions  Pending Prescriptions Disp Refills   diclofenac (VOLTAREN) 75 MG EC tablet [Pharmacy Med Name: Diclofenac Sodium 75 MG Oral Tablet Delayed Release] 180 tablet 0    Sig: Take 1 tablet by mouth twice daily     Analgesics:  NSAIDS Failed - 12/06/2023 12:11 PM      Failed - Manual Review: Labs are only required if the patient has taken medication for more than 8 weeks.      Passed - Cr in normal range and within 360 days    Creat  Date Value Ref Range Status  06/17/2023 0.89 0.70 - 1.30 mg/dL Final   Creatinine, Urine  Date Value Ref Range Status  06/17/2023 39 20 - 320 mg/dL Final         Passed - HGB in normal range and within 360 days    Hemoglobin  Date Value Ref Range Status  06/17/2023 13.5 13.2 - 17.1 g/dL Final   HGB  Date Value Ref Range Status  11/21/2014 13.9 13.0 - 18.0 g/dL Final         Passed - PLT in normal range and within 360 days    Platelets  Date Value Ref Range Status  06/17/2023 183 140 - 400 Thousand/uL Final   Platelet  Date Value Ref Range Status  11/21/2014 229 150 - 440 x10 3/mm 3 Final         Passed - HCT in normal range and within 360 days    HCT  Date Value Ref Range Status  06/17/2023 40.5 38.5 - 50.0 % Final  11/21/2014 41.9 40.0 - 52.0 % Final         Passed - eGFR is 30 or above and within 360 days    EGFR (African American)  Date Value Ref Range Status  11/21/2014 >60 >16mL/min Final   EGFR (Non-African Amer.)  Date Value Ref Range Status  11/21/2014 >60 >58mL/min Final    Comment:    eGFR values <31mL/min/1.73 m2 may be an indication of chronic kidney disease (CKD). Calculated eGFR, using the MRDR Study equation, is useful in  patients with stable renal function. The eGFR calculation will not be reliable in acutely ill patients when serum creatinine is changing rapidly. It is not useful in patients on dialysis. The eGFR calculation may not be applicable to patients at the low and high extremes of  body sizes, pregnant women, and vegetarians.    GFR  Date Value Ref Range Status  02/07/2019 85.90 >60.00 mL/min Final   eGFR  Date Value Ref Range Status  06/17/2023 100 > OR = 60 mL/min/1.41m2 Final         Passed - Patient is not pregnant      Passed - Valid encounter within last 12 months    Recent Outpatient Visits           5 months ago Type 2 diabetes mellitus without complication, without long-term current use of insulin [facility])   [facility] [place], Salvadore Oxford, NP   11 months ago Encounter for general adult medical examination with abnormal findings   [place] [facility] [place], Salvadore Oxford, NP   1 year ago Abscess of left groin   [place] [facility] [place], [place], NP   1 year ago Type 2 diabetes mellitus with hyperglycemia, without long-term current use of insulin [facility])    [facility] [place], Salvadore Oxford, NP   1  year ago Lightheadedness   [place] [facility] [place], Salvadore Oxford, NP       Future Appointments             In 2 weeks Sampson Si, Salvadore Oxford, NP [facility], [place]

## 2023-12-24 ENCOUNTER — Encounter: Payer: Self-pay | Admitting: Internal Medicine

## 2023-12-24 ENCOUNTER — Ambulatory Visit (INDEPENDENT_AMBULATORY_CARE_PROVIDER_SITE_OTHER): Payer: Medicaid Other | Admitting: Internal Medicine

## 2023-12-24 VITALS — BP 128/82 | Ht 69.0 in | Wt 193.4 lb

## 2023-12-24 DIAGNOSIS — Z125 Encounter for screening for malignant neoplasm of prostate: Secondary | ICD-10-CM

## 2023-12-24 DIAGNOSIS — Z0001 Encounter for general adult medical examination with abnormal findings: Secondary | ICD-10-CM

## 2023-12-24 DIAGNOSIS — E0849 Diabetes mellitus due to underlying condition with other diabetic neurological complication: Secondary | ICD-10-CM | POA: Diagnosis not present

## 2023-12-24 DIAGNOSIS — Z6828 Body mass index (BMI) 28.0-28.9, adult: Secondary | ICD-10-CM | POA: Diagnosis not present

## 2023-12-24 DIAGNOSIS — E119 Type 2 diabetes mellitus without complications: Secondary | ICD-10-CM | POA: Diagnosis not present

## 2023-12-24 DIAGNOSIS — E663 Overweight: Secondary | ICD-10-CM

## 2023-12-24 DIAGNOSIS — Z23 Encounter for immunization: Secondary | ICD-10-CM

## 2023-12-24 LAB — POCT GLYCOSYLATED HEMOGLOBIN (HGB A1C): Hemoglobin A1C: 5.4 % (ref 4.0–5.6)

## 2023-12-24 NOTE — Patient Instructions (Signed)
 Health Maintenance, Male  Adopting a healthy lifestyle and getting preventive care are important in promoting health and wellness. Ask your health care provider about:  The right schedule for you to have regular tests and exams.  Things you can do on your own to prevent diseases and keep yourself healthy.  What should I know about diet, weight, and exercise?  Eat a healthy diet    Eat a diet that includes plenty of vegetables, fruits, low-fat dairy products, and lean protein.  Do not eat a lot of foods that are high in solid fats, added sugars, or sodium.  Maintain a healthy weight  Body mass index (BMI) is a measurement that can be used to identify possible weight problems. It estimates body fat based on height and weight. Your health care provider can help determine your BMI and help you achieve or maintain a healthy weight.  Get regular exercise  Get regular exercise. This is one of the most important things you can do for your health. Most adults should:  Exercise for at least 150 minutes each week. The exercise should increase your heart rate and make you sweat (moderate-intensity exercise).  Do strengthening exercises at least twice a week. This is in addition to the moderate-intensity exercise.  Spend less time sitting. Even light physical activity can be beneficial.  Watch cholesterol and blood lipids  Have your blood tested for lipids and cholesterol at 59 years of age, then have this test every 5 years.  You may need to have your cholesterol levels checked more often if:  Your lipid or cholesterol levels are high.  You are older than 59 years of age.  You are at high risk for heart disease.  What should I know about cancer screening?  Many types of cancers can be detected early and may often be prevented. Depending on your health history and family history, you may need to have cancer screening at various ages. This may include screening for:  Colorectal cancer.  Prostate cancer.  Skin cancer.  Lung  cancer.  What should I know about heart disease, diabetes, and high blood pressure?  Blood pressure and heart disease  High blood pressure causes heart disease and increases the risk of stroke. This is more likely to develop in people who have high blood pressure readings or are overweight.  Talk with your health care provider about your target blood pressure readings.  Have your blood pressure checked:  Every 3-5 years if you are 24-52 years of age.  Every year if you are 3 years old or older.  If you are between the ages of 60 and 72 and are a current or former smoker, ask your health care provider if you should have a one-time screening for abdominal aortic aneurysm (AAA).  Diabetes  Have regular diabetes screenings. This checks your fasting blood sugar level. Have the screening done:  Once every three years after age 66 if you are at a normal weight and have a low risk for diabetes.  More often and at a younger age if you are overweight or have a high risk for diabetes.  What should I know about preventing infection?  Hepatitis B  If you have a higher risk for hepatitis B, you should be screened for this virus. Talk with your health care provider to find out if you are at risk for hepatitis B infection.  Hepatitis C  Blood testing is recommended for:  Everyone born from [birth year] through 1966.  Anyone  with known risk factors for hepatitis C.  Sexually transmitted infections (STIs)  You should be screened each year for STIs, including gonorrhea and chlamydia, if:  You are sexually active and are younger than 59 years of age.  You are older than 59 years of age and your health care provider tells you that you are at risk for this type of infection.  Your sexual activity has changed since you were last screened, and you are at increased risk for chlamydia or gonorrhea. Ask your health care provider if you are at risk.  Ask your health care provider about whether you are at high risk for HIV. Your health care provider  may recommend a prescription medicine to help prevent HIV infection. If you choose to take medicine to prevent HIV, you should first get tested for HIV. You should then be tested every 3 months for as long as you are taking the medicine.  Follow these instructions at home:  Alcohol use  Do not drink alcohol if your health care provider tells you not to drink.  If you drink alcohol:  Limit how much you have to 0-2 drinks a day.  Know how much alcohol is in your drink. In the [place] one drink equals one 12 oz bottle of beer (355 mL), one 5 oz glass of wine (148 mL), or one 1 oz glass of hard liquor (44 mL).  Lifestyle  Do not use any products that contain nicotine or tobacco. These products include cigarettes, chewing tobacco, and vaping devices, such as e-cigarettes. If you need help quitting, ask your health care provider.  Do not use street drugs.  Do not share needles.  Ask your health care provider for help if you need support or information about quitting drugs.  General instructions  Schedule regular health, dental, and eye exams.  Stay current with your vaccines.  Tell your health care provider if:  You often feel depressed.  You have ever been abused or do not feel safe at home.  Summary  Adopting a healthy lifestyle and getting preventive care are important in promoting health and wellness.  Follow your health care provider's instructions about healthy diet, exercising, and getting tested or screened for diseases.  Follow your health care provider's instructions on monitoring your cholesterol and blood pressure.  This information is not intended to replace advice given to you by your health care provider. Make sure you discuss any questions you have with your health care provider.  Document Revised: 04/28/2021 Document Reviewed: 04/28/2021  Elsevier Patient Education  2024 [employer].

## 2023-12-24 NOTE — Assessment & Plan Note (Signed)
 Encourage diet and exercise for weight loss

## 2023-12-24 NOTE — Progress Notes (Signed)
 Subjective:    Patient ID: Alex Carr, male    DOB: 05/25/1966, 58 y.o.   MRN: [id number]  HPI  Patient presents to clinic today for his annual exam.  Flu: 11/2022 Tetanus: 11/2015 Pneumovax: 11/2022 COVID:  x 2 Zostavax: 06/2016 Shingrix: Never PSA screening: 11/2022 Colon screening: 06/2022 Vision screening: annually, [facility] Dentist: as needed  Diet: He does eat meat. He consumes fruits and veggies. He does eat some fried foods. He drinks mostly coffee, water. Exercise: Walking  Review of Systems     Past Medical History:  Diagnosis Date   Arthritis    Chicken pox    Colon cancer (HCC) 2000   History of kidney stones    History of stomach ulcers    Hx of colonic polyps     Current Outpatient Medications  Medication Sig Dispense Refill   Ascorbic Acid (VITAMIN C) 1000 MG tablet Take 1,000 mg by mouth daily.     diclofenac  (VOLTAREN ) 75 MG EC tablet Take 1 tablet by mouth twice daily 180 tablet 0   lisinopril  (ZESTRIL ) 2.5 MG tablet Take 1 tablet by mouth once daily 90 tablet 1   lovastatin  (MEVACOR ) 20 MG tablet Take 1 tablet by mouth once a week 12 tablet 0   omeprazole  (PRILOSEC) 40 MG capsule Take 1 capsule (40 mg total) by mouth daily. 90 capsule 1   pyridOXINE (VITAMIN B-6) 100 MG tablet Take 100 mg by mouth 2 (two) times daily.     tadalafil  (CIALIS ) 20 MG tablet TAKE 1/2 TO 1 (ONE-HALF TO ONE) TABLET BY MOUTH EVERY OTHER DAY AS NEEDED FOR ERECTILE DYSFUNCTION 30 tablet 0   Zinc 25 MG TABS Take by mouth.     No current facility-administered medications for this visit.    Allergies  Allergen Reactions   Fluconazole Rash    Family History  Problem Relation Age of Onset   Cancer Father        Cancerous Polyps   Heart disease Father    Hyperlipidemia Father    Cancer Paternal Grandfather        Lung    Social History   Socioeconomic History   Marital status: Married    Spouse name: Not on file   Number of children: Not on file   Years of  education: Not on file   Highest education level: 12th grade  Occupational History   Not on file  Tobacco Use   Smoking status: Former    Current packs/day: 0.00    Types: Cigarettes    Quit date: 11/29/1998    Years since quitting: 25.0   Smokeless tobacco: Never   Tobacco comments:    Quit in 1999  Vaping Use   Vaping status: Never Used  Substance and Sexual Activity   Alcohol use: No   Drug use: No   Sexual activity: Yes  Other Topics Concern   Not on file  Social History Narrative   Not on file   Social Drivers of Health   Financial Resource Strain: Low Risk  (06/14/2023)   Overall Financial Resource Strain (CARDIA)    Difficulty of Paying Living Expenses: Not very hard  Food Insecurity: No Food Insecurity (06/14/2023)   Hunger Vital Sign    Worried About Running Out of Food in the Last Year: Never true    Ran Out of Food in the Last Year: Never true  Transportation Needs: No Transportation Needs (06/14/2023)   PRAPARE - Transportation    Lack of Transportation (  Medical): No    Lack of Transportation (Non-Medical): No  Physical Activity: Insufficiently Active (06/14/2023)   Exercise Vital Sign    Days of Exercise per Week: 2 days    Minutes of Exercise per Session: 30 min  Stress: No Stress Concern Present (06/14/2023)   [employer] of Occupational Health - Occupational Stress Questionnaire    Feeling of Stress : Not at all  Social Connections: Moderately Isolated (06/14/2023)   Social Connection and Isolation Panel [NHANES]    Frequency of Communication with Friends and Family: Once a week    Frequency of Social Gatherings with Friends and Family: Once a week    Attends Religious Services: More than 4 times per year    Active Member of [employer] or Organizations: No    Attends Engineer, Structural: Not on file    Marital Status: Married  Catering Manager Violence: Not on file     Constitutional: Patient reports unintentional weight gain.  Denies  fever, malaise, fatigue, headache.  HEENT: Denies eye pain, eye redness, ear pain, ringing in the ears, wax buildup, runny nose, nasal congestion, bloody nose, or sore throat. Respiratory: Denies difficulty breathing, shortness of breath, cough or sputum production.   Cardiovascular: Denies chest pain, chest tightness, palpitations or swelling in the hands or feet.  Gastrointestinal: Denies abdominal pain, bloating, constipation, diarrhea or blood in the stool.  GU: Patient reports erectile dysfunction.  Denies urgency, frequency, pain with urination, burning sensation, blood in urine, odor or discharge. Musculoskeletal: Patient reports joint pain.  Denies decrease in range of motion, difficulty with gait, muscle pain or joint swelling.  Skin: Pt reports lesion on right middle finger. Denies redness, rashes, or ulcercations.  Neurological: Denies dizziness, difficulty with memory, difficulty with speech or problems with balance and coordination.  Psych: Denies anxiety, depression, SI/HI.  No other specific complaints in a complete review of systems (except as listed in HPI above).  Objective:   Physical Exam  BP 128/82 (BP Location: Left Arm, Patient Position: Sitting, Cuff Size: Large)   Ht 5' 9 (1.753 m)   Wt 193 lb 6.4 oz (87.7 kg)   BMI 28.56 kg/m    Wt Readings from Last 3 Encounters:  06/17/23 173 lb (78.5 kg)  12/16/22 178 lb 12.8 oz (81.1 kg)  07/29/22 183 lb (83 kg)    General: Appears his stated age, overweight, in NAD. Skin: Warm, dry and intact.  No ulcerations noted.  Sebaceous cyst noted of left upper arm.  No ulcerations noted. HEENT: Head: normal shape and size; Eyes: sclera white, no icterus, conjunctiva pink, PERRLA and EOMs intact;  Neck:  Neck supple, trachea midline. No masses, lumps or thyromegaly present.  Cardiovascular: Normal rate and rhythm. S1,S2 noted.  No murmur, rubs or gallops noted. No JVD or BLE edema. No carotid bruits noted. Pulmonary/Chest:  Normal effort and positive vesicular breath sounds. No respiratory distress. No wheezes, rales or ronchi noted.  Abdomen: Normal bowel sounds.  Musculoskeletal: Strength 5/5 BUE/BLE.  No difficulty with gait.  Neurological: Alert and oriented. Cranial nerves II-XII grossly intact. Coordination normal.  Psychiatric: Mood and affect normal. Behavior is normal. Judgment and thought content normal.     BMET    Component Value Date/Time   NA 139 06/17/2023 0818   NA 138 11/21/2014 1758   K 4.9 06/17/2023 0818   K 3.9 11/21/2014 1758   CL 103 06/17/2023 0818   CL 105 11/21/2014 1758   CO2 26 06/17/2023 0818  CO2 25 11/21/2014 1758   GLUCOSE 107 06/17/2023 0818   GLUCOSE 88 11/21/2014 1758   BUN 31 (H) 06/17/2023 0818   BUN 14 11/21/2014 1758   CREATININE 0.89 06/17/2023 0818   CALCIUM 10.0 06/17/2023 0818   CALCIUM 8.9 11/21/2014 1758   GFRNONAA >60 11/21/2014 1758   GFRAA >60 11/21/2014 1758    Lipid Panel     Component Value Date/Time   CHOL 155 06/17/2023 0818   TRIG 46 06/17/2023 0818   HDL 76 06/17/2023 0818   CHOLHDL 2.0 06/17/2023 0818   VLDL 13.6 01/11/2017 1520   LDLCALC 66 06/17/2023 0818    CBC    Component Value Date/Time   WBC 3.6 (L) 06/17/2023 0818   RBC 4.30 06/17/2023 0818   HGB 13.5 06/17/2023 0818   HGB 13.9 11/21/2014 1758   HCT 40.5 06/17/2023 0818   HCT 41.9 11/21/2014 1758   PLT 183 06/17/2023 0818   PLT 229 11/21/2014 1758   MCV 94.2 06/17/2023 0818   MCV 92 11/21/2014 1758   MCH 31.4 06/17/2023 0818   MCHC 33.3 06/17/2023 0818   RDW 13.0 06/17/2023 0818   RDW 13.1 11/21/2014 1758   LYMPHSABS 1.8 11/21/2014 1758   MONOABS 0.6 11/21/2014 1758   EOSABS 0.1 11/21/2014 1758   BASOSABS 0.0 11/21/2014 1758    Hgb A1C Lab Results  Component Value Date   HGBA1C 5.6 06/17/2023           Assessment & Plan:   Preventative Health Maintenance:  Flu shot today Tetanus UTD Encouraged him to get his COVID booster Pneumovax  UTD Zostavax UTD, discussed Shingrix vaccine, he will check coverage with his insurance company and schedule a nurse visit if he would like to have this done Colon screening UTD Encouraged him to consume a balanced diet and exercise regimen Advised him to see an eye doctor and dentist annually We will check CBC, c-Met, lipid, A1c, PSA today  RTC in 6 months, follow-up chronic conditions Angeline Laura, NP

## 2023-12-25 LAB — COMPLETE METABOLIC PANEL WITH GFR
AG Ratio: 1.8 (calc) (ref 1.0–2.5)
ALT: 25 U/L (ref 9–46)
AST: 20 U/L (ref 10–35)
Albumin: 4.3 g/dL (ref 3.6–5.1)
Alkaline phosphatase (APISO): 142 U/L (ref 35–144)
BUN: 25 mg/dL (ref 7–25)
CO2: 28 mmol/L (ref 20–32)
Calcium: 9.7 mg/dL (ref 8.6–10.3)
Chloride: 106 mmol/L (ref 98–110)
Creat: 0.9 mg/dL (ref 0.70–1.30)
Globulin: 2.4 g/dL (ref 1.9–3.7)
Glucose, Bld: 124 mg/dL — ABNORMAL HIGH (ref 65–99)
Potassium: 4.8 mmol/L (ref 3.5–5.3)
Sodium: 141 mmol/L (ref 135–146)
Total Bilirubin: 0.5 mg/dL (ref 0.2–1.2)
Total Protein: 6.7 g/dL (ref 6.1–8.1)
eGFR: 99 mL/min/{1.73_m2} (ref >=60)

## 2023-12-25 LAB — CBC
HCT: 42.1 % (ref 38.5–50.0)
Hemoglobin: 14.1 g/dL (ref 13.2–17.1)
MCH: 31.3 pg (ref 27.0–33.0)
MCHC: 33.5 g/dL (ref 32.0–36.0)
MCV: 93.6 fL (ref 80.0–100.0)
MPV: 9.9 fL (ref 7.5–12.5)
Platelets: 193 10*3/uL (ref 140–400)
RBC: 4.5 10*6/uL (ref 4.20–5.80)
RDW: 12.6 % (ref 11.0–15.0)
WBC: 3.4 10*3/uL — ABNORMAL LOW (ref 3.8–10.8)

## 2023-12-25 LAB — LIPID PANEL
Cholesterol: 129 mg/dL (ref <200)
HDL: 63 mg/dL (ref >=40)
LDL Cholesterol (Calc): 57 mg/dL
Non-HDL Cholesterol (Calc): 66 mg/dL (ref <130)
Total CHOL/HDL Ratio: 2 (calc) (ref <5.0)
Triglycerides: 32 mg/dL (ref <150)

## 2023-12-25 LAB — PSA: PSA: 0.57 ng/mL (ref <=4.00)

## 2024-02-21 ENCOUNTER — Other Ambulatory Visit: Payer: Self-pay | Admitting: Internal Medicine

## 2024-02-22 NOTE — Telephone Encounter (Signed)
 Requested Prescriptions  Pending Prescriptions Disp Refills   lovastatin (MEVACOR) 20 MG tablet [Pharmacy Med Name: Lovastatin 20 MG Oral Tablet] 12 tablet 0    Sig: Take 1 tablet by mouth once a week     Cardiovascular:  Antilipid - Statins 2 Failed - 02/22/2024  1:05 PM      Failed - Lipid Panel in normal range within the last 12 months    Cholesterol  Date Value Ref Range Status  12/24/2023 129 <200 mg/dL Final   LDL Cholesterol (Calc)  Date Value Ref Range Status  12/24/2023 57 mg/dL (calc) Final    Comment:    Reference range: <100 . Desirable range <100 mg/dL for primary prevention;   <70 mg/dL for patients with CHD or diabetic patients  with > or = 2 CHD risk factors. Marland Kitchen LDL-C is now calculated using the Martin-Hopkins  calculation, which is a validated novel method providing  better accuracy than the Friedewald equation in the  estimation of LDL-C.  Horald Pollen et al. Lenox Ahr. 3009;233(00): 2061-2068  (http://education.[facility].com/faq/FAQ164)    HDL  Date Value Ref Range Status  12/24/2023 63 > OR = 40 mg/dL Final   Triglycerides  Date Value Ref Range Status  12/24/2023 32 <150 mg/dL Final         Passed - Cr in normal range and within 360 days    Creat  Date Value Ref Range Status  12/24/2023 0.90 0.70 - 1.30 mg/dL Final   Creatinine, Urine  Date Value Ref Range Status  06/17/2023 39 20 - 320 mg/dL Final         Passed - Patient is not pregnant      Passed - Valid encounter within last 12 months    Recent Outpatient Visits           2 months ago Encounter for general adult medical examination with abnormal findings   [place] [facility] [place], [place] W, NP   8 months ago Type 2 diabetes mellitus without complication, without long-term current use of insulin [facility])   [place] [facility] [place], Salvadore Oxford, NP   1 year ago Encounter for general adult medical examination with abnormal findings   [place] [facility] [place], Salvadore Oxford, NP   1 year ago Abscess of left groin   [place] [facility] [place], [place] W, NP   1 year ago Type 2 diabetes mellitus with hyperglycemia, without long-term current use of insulin [facility])   [place] [facility] [place], Salvadore Oxford, NP       Future Appointments             In 4 months Baity, Salvadore Oxford, NP  [facility], [facility]

## 2024-02-23 ENCOUNTER — Other Ambulatory Visit: Payer: Self-pay

## 2024-02-23 ENCOUNTER — Encounter: Payer: Self-pay | Admitting: Internal Medicine

## 2024-02-23 DIAGNOSIS — K219 Gastro-esophageal reflux disease without esophagitis: Secondary | ICD-10-CM

## 2024-02-23 MED ORDER — OMEPRAZOLE 40 MG PO CPDR: 40.0000 mg | DELAYED_RELEASE_CAPSULE | Freq: Every day | ORAL | 1 refills | Status: DC

## 2024-03-01 ENCOUNTER — Other Ambulatory Visit: Payer: Self-pay | Admitting: Internal Medicine

## 2024-03-01 NOTE — Telephone Encounter (Signed)
 Requested Prescriptions  Pending Prescriptions Disp Refills   diclofenac (VOLTAREN) 75 MG EC tablet [Pharmacy Med Name: Diclofenac Sodium 75 MG Oral Tablet Delayed Release] 180 tablet 0    Sig: Take 1 tablet by mouth twice daily     Analgesics:  NSAIDS Failed - 03/01/2024  4:52 PM      Failed - Manual Review: Labs are only required if the patient has taken medication for more than 8 weeks.      Passed - Cr in normal range and within 360 days    Creat  Date Value Ref Range Status  12/24/2023 0.90 0.70 - 1.30 mg/dL Final   Creatinine, Urine  Date Value Ref Range Status  06/17/2023 39 20 - 320 mg/dL Final         Passed - HGB in normal range and within 360 days    Hemoglobin  Date Value Ref Range Status  12/24/2023 14.1 13.2 - 17.1 g/dL Final   HGB  Date Value Ref Range Status  11/21/2014 13.9 13.0 - 18.0 g/dL Final         Passed - PLT in normal range and within 360 days    Platelets  Date Value Ref Range Status  12/24/2023 193 140 - 400 Thousand/uL Final   Platelet  Date Value Ref Range Status  11/21/2014 229 150 - 440 x10 3/mm 3 Final         Passed - HCT in normal range and within 360 days    HCT  Date Value Ref Range Status  12/24/2023 42.1 38.5 - 50.0 % Final  11/21/2014 41.9 40.0 - 52.0 % Final         Passed - eGFR is 30 or above and within 360 days    EGFR (African American)  Date Value Ref Range Status  11/21/2014 >60 >42mL/min Final   EGFR (Non-African Amer.)  Date Value Ref Range Status  11/21/2014 >60 >40mL/min Final    Comment:    eGFR values <81mL/min/1.73 m2 may be an indication of chronic kidney disease (CKD). Calculated eGFR, using the MRDR Study equation, is useful in  patients with stable renal function. The eGFR calculation will not be reliable in acutely ill patients when serum creatinine is changing rapidly. It is not useful in patients on dialysis. The eGFR calculation may not be applicable to patients at the low and high extremes of  body sizes, pregnant women, and vegetarians.    GFR  Date Value Ref Range Status  02/07/2019 85.90 >60.00 mL/min Final   eGFR  Date Value Ref Range Status  12/24/2023 99 > OR = 60 mL/min/1.60m2 Final         Passed - Patient is not pregnant      Passed - Valid encounter within last 12 months    Recent Outpatient Visits           2 months ago Encounter for general adult medical examination with abnormal findings   [place] [facility] [place], [place] W, NP   8 months ago Type 2 diabetes mellitus without complication, without long-term current use of insulin [facility])   [place] [facility] [place], Salvadore Oxford, NP   1 year ago Encounter for general adult medical examination with abnormal findings   [place] [facility] [place], Salvadore Oxford, NP   1 year ago Abscess of left groin   [place] [facility] [place], Salvadore Oxford, NP   1 year ago Type  2 diabetes mellitus with hyperglycemia, without long-term current use of insulin [facility])   [place] [facility] [place], Salvadore Oxford, NP       Future Appointments             In 3 months Baity, Salvadore Oxford, NP [facility]

## 2024-03-16 ENCOUNTER — Other Ambulatory Visit: Payer: Self-pay | Admitting: Internal Medicine

## 2024-03-17 NOTE — Telephone Encounter (Signed)
 Requested Prescriptions  Pending Prescriptions Disp Refills   lisinopril (ZESTRIL) 2.5 MG tablet [Pharmacy Med Name: Lisinopril 2.5 MG Oral Tablet] 90 tablet 0    Sig: Take 1 tablet by mouth once daily     Cardiovascular:  ACE Inhibitors Failed - 03/17/2024  1:51 PM      Failed - Valid encounter within last 6 months    Recent Outpatient Visits   None     Future Appointments             In 3 months Baity, Salvadore Oxford, NP [facility] [place]            Passed - Cr in normal range and within 180 days    Creat  Date Value Ref Range Status  12/24/2023 0.90 0.70 - 1.30 mg/dL Final   Creatinine, Urine  Date Value Ref Range Status  06/17/2023 39 20 - 320 mg/dL Final         Passed - K in normal range and within 180 days    Potassium  Date Value Ref Range Status  12/24/2023 4.8 3.5 - 5.3 mmol/L Final  11/21/2014 3.9 3.5 - 5.1 mmol/L Final         Passed - Patient is not pregnant      Passed - Last BP in normal range    BP Readings from Last 1 Encounters:  12/24/23 128/82

## 2024-05-23 ENCOUNTER — Other Ambulatory Visit (HOSPITAL_COMMUNITY): Payer: Self-pay

## 2024-05-23 ENCOUNTER — Telehealth: Payer: Self-pay

## 2024-05-23 ENCOUNTER — Encounter: Payer: Self-pay | Admitting: Internal Medicine

## 2024-05-23 NOTE — Telephone Encounter (Signed)
 Pharmacy Patient Advocate Encounter   Received notification from CoverMyMeds that prior authorization for Diclofenac  Sodium 75MG  dr tablets is required/requested.   Insurance verification completed.   The patient is insured through [facility] .   Per test claim: The current 90 day co-pay is, $4.00.  No PA needed at this time. This test claim was processed through [facility]- copay amounts may vary at other pharmacies due to pharmacy/plan contracts, or as the patient moves through the different stages of their insurance plan.

## 2024-05-24 ENCOUNTER — Other Ambulatory Visit: Payer: Self-pay

## 2024-05-24 MED ORDER — LOVASTATIN 20 MG PO TABS: 20.0000 mg | ORAL_TABLET | ORAL | 0 refills | Status: DC

## 2024-06-27 ENCOUNTER — Ambulatory Visit: Payer: Self-pay | Admitting: Internal Medicine

## 2024-08-09 ENCOUNTER — Other Ambulatory Visit: Payer: Self-pay | Admitting: Internal Medicine

## 2024-08-10 NOTE — Telephone Encounter (Signed)
 Requested Prescriptions  Pending Prescriptions Disp Refills   lovastatin  (MEVACOR ) 20 MG tablet [Pharmacy Med Name: Lovastatin  20 MG Oral Tablet] 12 tablet 0    Sig: Take 1 tablet by mouth once a week     Cardiovascular:  Antilipid - Statins 2 Failed - 08/10/2024  4:18 PM      Failed - Valid encounter within last 12 months    Recent Outpatient Visits   None            Failed - Lipid Panel in normal range within the last 12 months    Cholesterol  Date Value Ref Range Status  12/24/2023 129 <200 mg/dL Final   LDL Cholesterol (Calc)  Date Value Ref Range Status  12/24/2023 57 mg/dL (calc) Final    Comment:    Reference range: <100 . Desirable range <100 mg/dL for primary prevention;   <70 mg/dL for patients with CHD or diabetic patients  with > or = 2 CHD risk factors. SABRA LDL-C is now calculated using the Martin-Hopkins  calculation, which is a validated novel method providing  better accuracy than the Friedewald equation in the  estimation of LDL-C.  Gladis APPLETHWAITE et al. SANDREA. 7986;689(80): 2061-2068  (http://education.[facility].com/faq/FAQ164)    HDL  Date Value Ref Range Status  12/24/2023 63 > OR = 40 mg/dL Final   Triglycerides  Date Value Ref Range Status  12/24/2023 32 <150 mg/dL Final         Passed - Cr in normal range and within 360 days    Creat  Date Value Ref Range Status  12/24/2023 0.90 0.70 - 1.30 mg/dL Final   Creatinine, Urine  Date Value Ref Range Status  06/17/2023 39 20 - 320 mg/dL Final         Passed - Patient is not pregnant

## 2024-08-16 ENCOUNTER — Other Ambulatory Visit: Payer: Self-pay | Admitting: Internal Medicine

## 2024-08-18 ENCOUNTER — Encounter: Payer: Self-pay | Admitting: Internal Medicine

## 2024-08-18 NOTE — Telephone Encounter (Signed)
 Requested Prescriptions  Pending Prescriptions Disp Refills   lovastatin  (MEVACOR ) 20 MG tablet [Pharmacy Med Name: Lovastatin  20 MG Oral Tablet] 12 tablet 0    Sig: Take 1 tablet by mouth once a week     Cardiovascular:  Antilipid - Statins 2 Failed - 08/18/2024 12:29 PM      Failed - Valid encounter within last 12 months    Recent Outpatient Visits   None            Failed - Lipid Panel in normal range within the last 12 months    Cholesterol  Date Value Ref Range Status  12/24/2023 129 <200 mg/dL Final   LDL Cholesterol (Calc)  Date Value Ref Range Status  12/24/2023 57 mg/dL (calc) Final    Comment:    Reference range: <100 . Desirable range <100 mg/dL for primary prevention;   <70 mg/dL for patients with CHD or diabetic patients  with > or = 2 CHD risk factors. SABRA LDL-C is now calculated using the Martin-Hopkins  calculation, which is a validated novel method providing  better accuracy than the Friedewald equation in the  estimation of LDL-C.  Gladis APPLETHWAITE et al. SANDREA. 7986;689(80): 2061-2068  (http://education.[facility].com/faq/FAQ164)    HDL  Date Value Ref Range Status  12/24/2023 63 > OR = 40 mg/dL Final   Triglycerides  Date Value Ref Range Status  12/24/2023 32 <150 mg/dL Final         Passed - Cr in normal range and within 360 days    Creat  Date Value Ref Range Status  12/24/2023 0.90 0.70 - 1.30 mg/dL Final   Creatinine, Urine  Date Value Ref Range Status  06/17/2023 39 20 - 320 mg/dL Final         Passed - Patient is not pregnant

## 2024-08-22 MED ORDER — LOVASTATIN 20 MG PO TABS: 20.0000 mg | ORAL_TABLET | ORAL | 0 refills | Status: AC

## 2024-09-06 ENCOUNTER — Ambulatory Visit: Admitting: Internal Medicine

## 2024-09-06 ENCOUNTER — Encounter: Payer: Self-pay | Admitting: Internal Medicine

## 2024-09-06 VITALS — BP 138/84 | Ht 69.0 in | Wt 214.2 lb

## 2024-09-06 DIAGNOSIS — K219 Gastro-esophageal reflux disease without esophagitis: Secondary | ICD-10-CM | POA: Diagnosis not present

## 2024-09-06 DIAGNOSIS — E66811 Obesity, class 1: Secondary | ICD-10-CM

## 2024-09-06 DIAGNOSIS — M150 Primary generalized (osteo)arthritis: Secondary | ICD-10-CM | POA: Diagnosis not present

## 2024-09-06 DIAGNOSIS — Z6831 Body mass index (BMI) 31.0-31.9, adult: Secondary | ICD-10-CM | POA: Diagnosis not present

## 2024-09-06 DIAGNOSIS — R2689 Other abnormalities of gait and mobility: Secondary | ICD-10-CM | POA: Diagnosis not present

## 2024-09-06 DIAGNOSIS — Z23 Encounter for immunization: Secondary | ICD-10-CM

## 2024-09-06 DIAGNOSIS — I10 Essential (primary) hypertension: Secondary | ICD-10-CM

## 2024-09-06 DIAGNOSIS — E1165 Type 2 diabetes mellitus with hyperglycemia: Secondary | ICD-10-CM | POA: Diagnosis not present

## 2024-09-06 DIAGNOSIS — E6609 Other obesity due to excess calories: Secondary | ICD-10-CM | POA: Diagnosis not present

## 2024-09-06 DIAGNOSIS — N528 Other male erectile dysfunction: Secondary | ICD-10-CM | POA: Diagnosis not present

## 2024-09-06 MED ORDER — TADALAFIL 20 MG PO TABS: 20.0000 mg | ORAL_TABLET | Freq: Every day | ORAL | 0 refills | Status: AC | PRN

## 2024-09-06 NOTE — Assessment & Plan Note (Signed)
 Encouraged diet and exercise for weight loss ?

## 2024-09-06 NOTE — Assessment & Plan Note (Signed)
 Try to identify and avoid foods that trigger reflux He has failed weaning omeprazole  down to 20 mg in the past, will continue 40 mg daily

## 2024-09-06 NOTE — Assessment & Plan Note (Signed)
 Continue tadalafil  20 mg daily as needed

## 2024-09-06 NOTE — Assessment & Plan Note (Signed)
 A1c and urine microalbumin today Encouraged him to consume a low-carb diet and exercise for weight loss No medications Encouraged routine eye exam Encouraged routine foot exam Flu shot today Prevnar UTD Encouraged him to get his COVID booster

## 2024-09-06 NOTE — Patient Instructions (Signed)
 Hypertension, Adult Hypertension is another name for high blood pressure. High blood pressure forces your heart to work harder to pump blood. This can cause problems over time. There are two numbers in a blood pressure reading. There is a top number (systolic) over a bottom number (diastolic). It is best to have a blood pressure that is below 120/80. What are the causes? The cause of this condition is not known. Some other conditions can lead to high blood pressure. What increases the risk? Some lifestyle factors can make you more likely to develop high blood pressure: Smoking. Not getting enough exercise or physical activity. Being overweight. Having too much fat, sugar, calories, or salt (sodium) in your diet. Drinking too much alcohol. Other risk factors include: Having any of these conditions: Heart disease. Diabetes. High cholesterol. Kidney disease. Obstructive sleep apnea. Having a family history of high blood pressure and high cholesterol. Age. The risk increases with age. Stress. What are the signs or symptoms? High blood pressure may not cause symptoms. Very high blood pressure (hypertensive crisis) may cause: Headache. Fast or uneven heartbeats (palpitations). Shortness of breath. Nosebleed. Vomiting or feeling like you may vomit (nauseous). Changes in how you see. Very bad chest pain. Feeling dizzy. Seizures. How is this treated? This condition is treated by making healthy lifestyle changes, such as: Eating healthy foods. Exercising more. Drinking less alcohol. Your doctor may prescribe medicine if lifestyle changes do not help enough and if: Your top number is above 130. Your bottom number is above 80. Your personal target blood pressure may vary. Follow these instructions at home: Eating and drinking  If told, follow the DASH eating plan. To follow this plan: Fill one half of your plate at each meal with fruits and vegetables. Fill one fourth of your plate  at each meal with whole grains. Whole grains include whole-wheat pasta, brown rice, and whole-grain bread. Eat or drink low-fat dairy products, such as skim milk or low-fat yogurt. Fill one fourth of your plate at each meal with low-fat (lean) proteins. Low-fat proteins include fish, chicken without skin, eggs, beans, and tofu. Avoid fatty meat, cured and processed meat, or chicken with skin. Avoid pre-made or processed food. Limit the amount of salt in your diet to less than 1,500 mg each day. Do not drink alcohol if: Your doctor tells you not to drink. You are pregnant, may be pregnant, or are planning to become pregnant. If you drink alcohol: Limit how much you have to: 0-1 drink a day for women. 0-2 drinks a day for men. Know how much alcohol is in your drink. In the [place] one drink equals one 12 oz bottle of beer (355 mL), one 5 oz glass of wine (148 mL), or one 1 oz glass of hard liquor (44 mL). Lifestyle  Work with your doctor to stay at a healthy weight or to lose weight. Ask your doctor what the best weight is for you. Get at least 30 minutes of exercise that causes your heart to beat faster (aerobic exercise) most days of the week. This may include walking, swimming, or biking. Get at least 30 minutes of exercise that strengthens your muscles (resistance exercise) at least 3 days a week. This may include lifting weights or doing Pilates. Do not smoke or use any products that contain nicotine or tobacco. If you need help quitting, ask your doctor. Check your blood pressure at home as told by your doctor. Keep all follow-up visits. Medicines Take over-the-counter and prescription medicines  only as told by your doctor. Follow directions carefully. Do not skip doses of blood pressure medicine. The medicine does not work as well if you skip doses. Skipping doses also puts you at risk for problems. Ask your doctor about side effects or reactions to medicines that you should watch  for. Contact a doctor if: You think you are having a reaction to the medicine you are taking. You have headaches that keep coming back. You feel dizzy. You have swelling in your ankles. You have trouble with your vision. Get help right away if: You get a very bad headache. You start to feel mixed up (confused). You feel weak or numb. You feel faint. You have very bad pain in your: Chest. Belly (abdomen). You vomit more than once. You have trouble breathing. These symptoms may be an emergency. Get help right away. Call 911. Do not wait to see if the symptoms will go away. Do not drive yourself to the hospital. Summary Hypertension is another name for high blood pressure. High blood pressure forces your heart to work harder to pump blood. For most people, a normal blood pressure is less than 120/80. Making healthy choices can help lower blood pressure. If your blood pressure does not get lower with healthy choices, you may need to take medicine. This information is not intended to replace advice given to you by your health care provider. Make sure you discuss any questions you have with your health care provider. Document Revised: 09/25/2021 Document Reviewed: 09/25/2021 Elsevier Patient Education  2024 [employer].

## 2024-09-06 NOTE — Assessment & Plan Note (Signed)
 Discussed BP goal of less than 130/80 Continue lisinopril  2.5 mg daily for now, did discuss increasing dose at next visit if BP is still uncontrolled Reinforced DASH diet and exercise for weight loss C-Met today

## 2024-09-06 NOTE — Assessment & Plan Note (Signed)
 Will decrease diclofenac  to 75 mg daily only as needed Will monitor at this time

## 2024-09-06 NOTE — Progress Notes (Signed)
 Subjective:    Patient ID: Alex Carr, male    DOB: 09-Aug-1966, 58 y.o.   MRN: [id number]  HPI  Patient presents to clinic today for 40-month follow-up of chronic conditions.  HTN: His BP today is 138/90.  He is taking lisinopril  as prescribed.  ECG from 05/2018 reviewed.  DM2: His last A1c was 5.4%, 12/2023.  He is not taking any oral diabetic medication at this time. He is taking lovastatin  once weekly. He does not check his sugars routinely.  He checks his feet routinely.  His last eye exam was < 1 year ago, [place].  Flu 12/2023.  Pneumovax never.  Prevnar 20 11/2022.  COVID Moderna x 2.  GERD: He is not sure what triggers this. He denies breakthrough on omeprazole .  There is no upper GI on file.  OA: Mainly in his right shoulder.  He takes diclofenac  once daily with good relief of symptoms.  He does not follow with orthopedics.  ED: Managed with tadalafil  as needed.  He does not follow with urology.  He also reports difficulty with balance. He noticed this a few months ago. It is intermittent. He does not feel lightheaded or dizzy.  He has not had any syncopal episodes.  He denies any numbness, tingling or weakness in his feet consistent with neuropathy.  He has not had any falls because of this.  Review of Systems     Past Medical History:  Diagnosis Date   Arthritis    Chicken pox    Colon cancer (HCC) 2000   GERD (gastroesophageal reflux disease)    History of kidney stones    History of stomach ulcers    Hx of colonic polyps     Current Outpatient Medications  Medication Sig Dispense Refill   Ascorbic Acid (VITAMIN C) 1000 MG tablet Take 1,000 mg by mouth daily.     diclofenac  (VOLTAREN ) 75 MG EC tablet Take 1 tablet by mouth twice daily 180 tablet 1   lisinopril  (ZESTRIL ) 2.5 MG tablet Take 1 tablet by mouth once daily 90 tablet 1   lovastatin  (MEVACOR ) 20 MG tablet Take 1 tablet (20 mg total) by mouth once a week. 12 tablet 0   omeprazole  (PRILOSEC) 40 MG capsule  Take 1 capsule (40 mg total) by mouth daily. 90 capsule 1   pyridOXINE (VITAMIN B-6) 100 MG tablet Take 100 mg by mouth 2 (two) times daily.     Zinc 25 MG TABS Take by mouth.     tadalafil  (CIALIS ) 20 MG tablet Take 1 tablet (20 mg total) by mouth daily as needed for erectile dysfunction. 30 tablet 0   No current facility-administered medications for this visit.    Allergies  Allergen Reactions   Fluconazole Rash    Family History  Problem Relation Age of Onset   Cancer Father        Cancerous Polyps   Heart disease Father    Hyperlipidemia Father    Cancer Paternal Grandfather        Lung    Social History   Socioeconomic History   Marital status: Married    Spouse name: Not on file   Number of children: Not on file   Years of education: Not on file   Highest education level: 12th grade  Occupational History   Not on file  Tobacco Use   Smoking status: Former    Current packs/day: 0.00    Types: Cigarettes    Quit date: 11/29/1998  Years since quitting: 25.7   Smokeless tobacco: Never   Tobacco comments:    Quit in 1999  Vaping Use   Vaping status: Never Used  Substance and Sexual Activity   Alcohol use: No   Drug use: No   Sexual activity: Yes  Other Topics Concern   Not on file  Social History Narrative   Not on file   Social Drivers of Health   Financial Resource Strain: Low Risk  (09/04/2024)   Overall Financial Resource Strain (CARDIA)    Difficulty of Paying Living Expenses: Not hard at all  Food Insecurity: No Food Insecurity (09/04/2024)   Hunger Vital Sign    Worried About Running Out of Food in the Last Year: Never true    Ran Out of Food in the Last Year: Never true  Transportation Needs: No Transportation Needs (09/04/2024)   PRAPARE - Administrator, Civil Service (Medical): No    Lack of Transportation (Non-Medical): No  Physical Activity: Inactive (09/04/2024)   Exercise Vital Sign    Days of Exercise per Week: 0 days     Minutes of Exercise per Session: Not on file  Stress: No Stress Concern Present (09/04/2024)   [employer] of Occupational Health - Occupational Stress Questionnaire    Feeling of Stress: Not at all  Social Connections: Moderately Isolated (09/04/2024)   Social Connection and Isolation Panel    Frequency of Communication with Friends and Family: Never    Frequency of Social Gatherings with Friends and Family: Once a week    Attends Religious Services: More than 4 times per year    Active Member of [employer] or Organizations: No    Attends Engineer, structural: Not on file    Marital Status: Married  Catering manager Violence: Not on file     Constitutional: Denies fever, malaise, fatigue, headache or abrupt weight changes.  HEENT: Denies eye pain, eye redness, ear pain, ringing in the ears, wax buildup, runny nose, nasal congestion, bloody nose, or sore throat. Respiratory: Denies difficulty breathing, shortness of breath, cough or sputum production.   Cardiovascular: Denies chest pain, chest tightness, palpitations or swelling in the hands or feet.  Gastrointestinal: Denies abdominal pain, bloating, constipation, diarrhea or blood in the stool.  GU: Patient reports erectile dysfunction.  Denies urgency, frequency, pain with urination, burning sensation, blood in urine, odor or discharge. Musculoskeletal: Patient reports right shoulder pain.  Denies decrease in range of motion, difficulty with gait, muscle pain or joint swelling.  Skin: Denies redness, rashes, or ulcercations.  Neurological: Patient reports balance problem. Denies dizziness, difficulty with memory, difficulty with speech or problems with balance and coordination.  Psych: Denies anxiety, depression, SI/HI.  No other specific complaints in a complete review of systems (except as listed in HPI above).  Objective:   Physical Exam  BP 138/84   Ht 5' 9 (1.753 m)   Wt 214 lb 3.2 oz (97.2 kg)   BMI 31.63 kg/m     Wt Readings from Last 3 Encounters:  09/06/24 214 lb 3.2 oz (97.2 kg)  12/24/23 193 lb 6.4 oz (87.7 kg)  06/17/23 173 lb (78.5 kg)    General: Appears his stated age, overweight, in NAD. Skin: Warm, dry and intact. No ulcerations noted. HEENT: Head: normal shape and size; Eyes: sclera white, no icterus, conjunctiva pink, PERRLA and EOMs intact;  Cardiovascular: Normal rate and rhythm. S1,S2 noted.  No murmur, rubs or gallops noted. No JVD or BLE edema. No  carotid bruits noted. Pulmonary/Chest: Normal effort and positive vesicular breath sounds. No respiratory distress. No wheezes, rales or ronchi noted.  Abdomen: Normal bowel sounds.  Musculoskeletal: Able to stand on tiptoes and heels.  No difficulty with gait.  Neurological: Alert and oriented. Unable to tandem walk. Psychiatric: Mood and affect normal. Behavior is normal. Judgment and thought content normal.     BMET    Component Value Date/Time   NA 141 12/24/2023 0838   NA 138 11/21/2014 1758   K 4.8 12/24/2023 0838   K 3.9 11/21/2014 1758   CL 106 12/24/2023 0838   CL 105 11/21/2014 1758   CO2 28 12/24/2023 0838   CO2 25 11/21/2014 1758   GLUCOSE 124 (H) 12/24/2023 0838   GLUCOSE 88 11/21/2014 1758   BUN 25 12/24/2023 0838   BUN 14 11/21/2014 1758   CREATININE 0.90 12/24/2023 0838   CALCIUM 9.7 12/24/2023 0838   CALCIUM 8.9 11/21/2014 1758   GFRNONAA >60 11/21/2014 1758   GFRAA >60 11/21/2014 1758    Lipid Panel     Component Value Date/Time   CHOL 129 12/24/2023 0838   TRIG 32 12/24/2023 0838   HDL 63 12/24/2023 0838   CHOLHDL 2.0 12/24/2023 0838   VLDL 13.6 01/11/2017 1520   LDLCALC 57 12/24/2023 0838    CBC    Component Value Date/Time   WBC 3.4 (L) 12/24/2023 0838   RBC 4.50 12/24/2023 0838   HGB 14.1 12/24/2023 0838   HGB 13.9 11/21/2014 1758   HCT 42.1 12/24/2023 0838   HCT 41.9 11/21/2014 1758   PLT 193 12/24/2023 0838   PLT 229 11/21/2014 1758   MCV 93.6 12/24/2023 0838   MCV 92  11/21/2014 1758   MCH 31.3 12/24/2023 0838   MCHC 33.5 12/24/2023 0838   RDW 12.6 12/24/2023 0838   RDW 13.1 11/21/2014 1758   LYMPHSABS 1.8 11/21/2014 1758   MONOABS 0.6 11/21/2014 1758   EOSABS 0.1 11/21/2014 1758   BASOSABS 0.0 11/21/2014 1758    Hgb A1C Lab Results  Component Value Date   HGBA1C 5.4 12/24/2023           Assessment & Plan:   Balance problem:  Offered referral to PT for further evaluation and treatment however he would like to hold off at this time Will continue to monitor  RTC in 6 months for annual exam Angeline Laura, NP

## 2024-09-07 ENCOUNTER — Ambulatory Visit: Payer: Self-pay | Admitting: Internal Medicine

## 2024-09-07 LAB — LIPID PANEL
Cholesterol: 149 mg/dL (ref <200)
HDL: 50 mg/dL (ref >=40)
LDL Cholesterol (Calc): 80 mg/dL
Non-HDL Cholesterol (Calc): 99 mg/dL (ref <130)
Total CHOL/HDL Ratio: 3 (calc) (ref <5.0)
Triglycerides: 106 mg/dL (ref <150)

## 2024-09-07 LAB — CBC
HCT: 41.8 % (ref 38.5–50.0)
Hemoglobin: 13.7 g/dL (ref 13.2–17.1)
MCH: 31.4 pg (ref 27.0–33.0)
MCHC: 32.8 g/dL (ref 32.0–36.0)
MCV: 95.9 fL (ref 80.0–100.0)
MPV: 9.8 fL (ref 7.5–12.5)
Platelets: 223 Thousand/uL (ref 140–400)
RBC: 4.36 Million/uL (ref 4.20–5.80)
RDW: 13.1 % (ref 11.0–15.0)
WBC: 3.8 Thousand/uL (ref 3.8–10.8)

## 2024-09-07 LAB — HEMOGLOBIN A1C
Hgb A1c MFr Bld: 6.1 % — ABNORMAL HIGH (ref <5.7)
Mean Plasma Glucose: 128 mg/dL
eAG (mmol/L): 7.1 mmol/L

## 2024-09-07 LAB — MICROALBUMIN / CREATININE URINE RATIO
Creatinine, Urine: 120 mg/dL (ref 20–320)
Microalb Creat Ratio: 148 mg/g{creat} — ABNORMAL HIGH (ref <30)
Microalb, Ur: 17.7 mg/dL

## 2024-09-07 LAB — COMPREHENSIVE METABOLIC PANEL WITH GFR
AG Ratio: 1.8 (calc) (ref 1.0–2.5)
ALT: 27 U/L (ref 9–46)
AST: 24 U/L (ref 10–35)
Albumin: 4.3 g/dL (ref 3.6–5.1)
Alkaline phosphatase (APISO): 139 U/L (ref 35–144)
BUN: 12 mg/dL (ref 7–25)
CO2: 30 mmol/L (ref 20–32)
Calcium: 9.2 mg/dL (ref 8.6–10.3)
Chloride: 103 mmol/L (ref 98–110)
Creat: 0.78 mg/dL (ref 0.70–1.30)
Globulin: 2.4 g/dL (ref 1.9–3.7)
Glucose, Bld: 107 mg/dL (ref 65–139)
Potassium: 4.4 mmol/L (ref 3.5–5.3)
Sodium: 139 mmol/L (ref 135–146)
Total Bilirubin: 0.5 mg/dL (ref 0.2–1.2)
Total Protein: 6.7 g/dL (ref 6.1–8.1)
eGFR: 103 mL/min/1.73m2 (ref >=60)

## 2024-09-14 ENCOUNTER — Other Ambulatory Visit: Payer: Self-pay | Admitting: Internal Medicine

## 2024-09-14 DIAGNOSIS — K219 Gastro-esophageal reflux disease without esophagitis: Secondary | ICD-10-CM

## 2024-09-15 NOTE — Telephone Encounter (Signed)
 Requested Prescriptions  Pending Prescriptions Disp Refills   diclofenac  (VOLTAREN ) 75 MG EC tablet [Pharmacy Med Name: Diclofenac  Sodium 75 MG Oral Tablet Delayed Release] 180 tablet 1    Sig: Take 1 tablet by mouth twice daily     Analgesics:  NSAIDS Failed - 09/15/2024  2:02 PM      Failed - Manual Review: Labs are only required if the patient has taken medication for more than 8 weeks.      Passed - Cr in normal range and within 360 days    Creat  Date Value Ref Range Status  09/06/2024 0.78 0.70 - 1.30 mg/dL Final   Creatinine, Urine  Date Value Ref Range Status  09/06/2024 120 20 - 320 mg/dL Final         Passed - HGB in normal range and within 360 days    Hemoglobin  Date Value Ref Range Status  09/06/2024 13.7 13.2 - 17.1 g/dL Final   HGB  Date Value Ref Range Status  11/21/2014 13.9 13.0 - 18.0 g/dL Final         Passed - PLT in normal range and within 360 days    Platelets  Date Value Ref Range Status  09/06/2024 223 140 - 400 Thousand/uL Final   Platelet  Date Value Ref Range Status  11/21/2014 229 150 - 440 x10 3/mm 3 Final         Passed - HCT in normal range and within 360 days    HCT  Date Value Ref Range Status  09/06/2024 41.8 38.5 - 50.0 % Final  11/21/2014 41.9 40.0 - 52.0 % Final         Passed - eGFR is 30 or above and within 360 days    EGFR (African American)  Date Value Ref Range Status  11/21/2014 >60 >81mL/min Final   EGFR (Non-African Amer.)  Date Value Ref Range Status  11/21/2014 >60 >22mL/min Final    Comment:    eGFR values <4mL/min/1.73 m2 may be an indication of chronic kidney disease (CKD). Calculated eGFR, using the MRDR Study equation, is useful in  patients with stable renal function. The eGFR calculation will not be reliable in acutely ill patients when serum creatinine is changing rapidly. It is not useful in patients on dialysis. The eGFR calculation may not be applicable to patients at the low and high extremes of  body sizes, pregnant women, and vegetarians.    GFR  Date Value Ref Range Status  02/07/2019 85.90 >60.00 mL/min Final   eGFR  Date Value Ref Range Status  09/06/2024 103 > OR = 60 mL/min/1.33m2 Final         Passed - Patient is not pregnant      Passed - Valid encounter within last 12 months    Recent Outpatient Visits           1 week ago Type 2 diabetes mellitus with hyperglycemia, without long-term current use of insulin [facility])   [place] [facility] [place], [place] W, NP               lisinopril  (ZESTRIL ) 2.5 MG tablet [Pharmacy Med Name: Lisinopril  2.5 MG Oral Tablet] 90 tablet 1    Sig: Take 1 tablet by mouth once daily     Cardiovascular:  ACE Inhibitors Passed - 09/15/2024  2:02 PM      Passed - Cr in normal range and within 180 days    Creat  Date Value Ref Range  Status  09/06/2024 0.78 0.70 - 1.30 mg/dL Final   Creatinine, Urine  Date Value Ref Range Status  09/06/2024 120 20 - 320 mg/dL Final         Passed - K in normal range and within 180 days    Potassium  Date Value Ref Range Status  09/06/2024 4.4 3.5 - 5.3 mmol/L Final  11/21/2014 3.9 3.5 - 5.1 mmol/L Final         Passed - Patient is not pregnant      Passed - Last BP in normal range    BP Readings from Last 1 Encounters:  09/06/24 138/84         Passed - Valid encounter within last 6 months    Recent Outpatient Visits           1 week ago Type 2 diabetes mellitus with hyperglycemia, without long-term current use of insulin [facility])   [place] [facility] [place], [place] W, NP               omeprazole  (PRILOSEC) 40 MG capsule [Pharmacy Med Name: OMEPRAZOLE  DR 40MG   CAP] 90 capsule 1    Sig: Take 1 capsule by mouth once daily     Gastroenterology: Proton Pump Inhibitors Passed - 09/15/2024  2:02 PM      Passed - Valid encounter within last 12 months    Recent Outpatient Visits           1 week ago Type 2 diabetes mellitus with hyperglycemia,  without long-term current use of insulin [facility])   [place] [facility] [place], Angeline ORN, [place]

## 2024-12-29 ENCOUNTER — Encounter: Admitting: Internal Medicine
